# Patient Record
Sex: Female | Born: 1985 | Race: Black or African American | Hispanic: No | Marital: Single | Smoking: Current every day smoker
Health system: Southern US, Community
[De-identification: ages and names within clinical notes are randomized; demographics above are authoritative.]

---

## 2017-04-22 ENCOUNTER — Emergency Department (HOSPITAL_COMMUNITY)
Admission: EM | Admit: 2017-04-22 | Discharge: 2017-04-22 | Disposition: A | Discharge status: Home / Self Care | Payer: Self-pay | Attending: Emergency Medicine | Admitting: Emergency Medicine

## 2017-04-22 ENCOUNTER — Emergency Department (HOSPITAL_COMMUNITY): Payer: Self-pay

## 2017-04-22 ENCOUNTER — Encounter (HOSPITAL_COMMUNITY): Payer: Self-pay | Admitting: *Deleted

## 2017-04-22 DIAGNOSIS — F17210 Nicotine dependence, cigarettes, uncomplicated: Secondary | ICD-10-CM | POA: Insufficient documentation

## 2017-04-22 DIAGNOSIS — M79671 Pain in right foot: Secondary | ICD-10-CM | POA: Insufficient documentation

## 2017-04-22 DIAGNOSIS — R062 Wheezing: Secondary | ICD-10-CM | POA: Insufficient documentation

## 2017-04-22 DIAGNOSIS — R05 Cough: Secondary | ICD-10-CM | POA: Insufficient documentation

## 2017-04-22 DIAGNOSIS — R059 Cough, unspecified: Secondary | ICD-10-CM

## 2017-04-22 DIAGNOSIS — M79672 Pain in left foot: Secondary | ICD-10-CM | POA: Insufficient documentation

## 2017-04-22 DIAGNOSIS — J45909 Unspecified asthma, uncomplicated: Secondary | ICD-10-CM | POA: Insufficient documentation

## 2017-04-22 MED ORDER — BENZONATATE 100 MG PO CAPS: 100.0000 mg | ORAL_CAPSULE | Freq: Three times a day (TID) | ORAL | 0 refills | Status: DC | PRN

## 2017-04-22 MED ORDER — ALBUTEROL SULFATE HFA 108 (90 BASE) MCG/ACT IN AERS
1.0000 | INHALATION_SPRAY | Freq: Once | RESPIRATORY_TRACT | Status: AC
  Administered 2017-04-22: 2 via RESPIRATORY_TRACT
  Filled 2017-04-22: qty 6.7

## 2017-04-22 NOTE — ED Provider Notes (Signed)
[facility]-EMERGENCY DEPT Provider Note   CSN: [id number] Arrival date & time: 04/22/17  0813     History   Chief Complaint Chief Complaint  Patient presents with  . Cough  . Ankle Pain    HPI Laurie Holland is a 31 y.o. female with past medical history of asthma, presenting to the ED with persistent cough 3 weeks. Patient states cough began with a cold which consisted of sore throat and congestion, however those symptoms have resolved and she has had a lingering cough that is worse at night. She states cough is sometimes productive of white phlegm. Denies shortness of breath or fever. Reports history of asthma as a child, however it can sometimes act up with illnesses. States she does not have an inhaler at home. Patient also reports a second complaint today of bilateral heel pain that is worse first thing in the morning and improves as she walks around. She states they feel stiff with walking. No medications tried for pain. Denies recent injuries.  The history is provided by the patient.    History reviewed. No pertinent past medical history.  There are no active problems to display for this patient.   History reviewed. No pertinent surgical history.  OB History    No data available       Home Medications    Prior to Admission medications   Medication Sig Start Date End Date Taking? Authorizing Provider  benzonatate (TESSALON) 100 MG capsule Take 1 capsule (100 mg total) by mouth 3 (three) times daily as needed for cough. 04/22/17   Russo, [place] N, PA-C    Family History History reviewed. No pertinent family history.  Social History Social History  Substance Use Topics  . Smoking status: Current Every Day Smoker  . Smokeless tobacco: Not on file  . Alcohol use Yes     Allergies   Penicillins   Review of Systems Review of Systems  Constitutional: Negative for chills and fever.  HENT: Negative for congestion, ear pain, sore throat, trouble swallowing and  voice change.   Respiratory: Positive for cough and wheezing (at night). Negative for shortness of breath.   Cardiovascular: Negative for chest pain.  Gastrointestinal: Negative for abdominal pain and nausea.  Musculoskeletal: Positive for myalgias. Negative for joint swelling.  Neurological: Negative for numbness.     Physical Exam Updated Vital Signs BP (!) 148/94 (BP Location: Right Arm)   Pulse (!) 106   Temp 98.6 F (37 C) (Oral)   Resp 17   LMP 03/31/2017   SpO2 100%   Physical Exam  Constitutional: She appears well-developed and well-nourished. No distress.  HENT:  Head: Normocephalic and atraumatic.  Right Ear: Hearing, tympanic membrane, external ear and ear canal normal.  Left Ear: Hearing, tympanic membrane, external ear and ear canal normal.  Nose: Nose normal.  Mouth/Throat: Uvula is midline and oropharynx is clear and moist. No trismus in the jaw. No uvula swelling.  Eyes: Conjunctivae are normal.  Neck: Normal range of motion. Neck supple. No tracheal deviation present.  Cardiovascular: Normal rate, regular rhythm, normal heart sounds and intact distal pulses.   Pulmonary/Chest: Effort normal and breath sounds normal. No stridor. No respiratory distress. She has no wheezes. She has no rales.  Abdominal: Soft. Bowel sounds are normal. There is no tenderness.  Musculoskeletal:  Bilateral arches of feet and heels with tenderness. Ankles and dorsal feet without tenderness, normal range of motion, no edema or deformities. Distal pulses. Normal sensation.  Lymphadenopathy:  She has no cervical adenopathy.  Psychiatric: She has a normal mood and affect. Her behavior is normal.  Nursing note and vitals reviewed.    ED Treatments / Results  Labs (all labs ordered are listed, but only abnormal results are displayed) Labs Reviewed - No data to display  EKG  EKG Interpretation None       Radiology Dg Chest 2 View  Result Date: 04/22/2017 CLINICAL DATA:   Persistent cough, 3 weeks duration with sputum production. EXAM: CHEST  2 VIEW COMPARISON:  None. FINDINGS: Heart size is normal. Mediastinal shadows are normal. The lungs are clear. No bronchial thickening. No infiltrate, mass, effusion or collapse. Pulmonary vascularity is normal. No bony abnormality. IMPRESSION: Normal chest Electronically Signed   By: Paulina Fusi M.D.   On: 04/22/2017 08:53    Procedures Procedures (including critical care time)  Medications Ordered in ED Medications  albuterol (PROVENTIL HFA;VENTOLIN HFA) 108 (90 Base) MCG/ACT inhaler 1-2 puff (2 puffs Inhalation Given 04/22/17 0914)     Initial Impression / Assessment and Plan / ED Course  I have reviewed the triage vital signs and the nursing notes.  Pertinent labs & imaging results that were available during my care of the patient were reviewed by me and considered in my medical decision making (see chart for details).     Presenting with persistent cough that is worse in the evening, as well as bilateral heel pain and stiffness in the mornings. Lungs are clear, ENT exam unremarkable. Suspect bronchospasm secondary to recent viral illness versus asthma. No respiratory distress in ED. Vital signs stable. Regarding bilateral foot pain, suspect plantar fasciitis versus tendinitis. Conservative therapy discussed and recommended. Patient to follow up with PCP regarding today's visit. Will discharge with albuterol inhaler and Tessalon perles. Patient is safe for discharge.  Discussed results, findings, treatment and follow up. Patient advised of return precautions. Patient verbalized understanding and agreed with plan.   Final Clinical Impressions(s) / ED Diagnoses   Final diagnoses:  Cough  Bilateral foot pain    New Prescriptions New Prescriptions   BENZONATATE (TESSALON) 100 MG CAPSULE    Take 1 capsule (100 mg total) by mouth 3 (three) times daily as needed for cough.     Russo, [place] N, PA-C 04/22/17  9604    Vanetta Mulders, MD 04/24/17 [phone]

## 2017-04-22 NOTE — ED Triage Notes (Addendum)
Pt reports having a persistent cough x 3 weeks with white sputum. Denies fever. Airway intact at triage. Also reports ongoing bilateral ankle pain and stiffness in morning. No injury and ambulatory at triage.

## 2017-04-22 NOTE — ED Notes (Signed)
Patient transported to X-ray. Family waiting in pt room.

## 2017-04-22 NOTE — Discharge Instructions (Signed)
Please read instructions below. Apply ice to your feet for 20 minutes at a time. Do gentle stretches daily to help symptoms. You can take advil every 6 hours as needed for pain. You can take tessalon every 8 hours as needed for cough.  Use the albuterol inhaler if you begin wheezing or have shortness of breath. You can use 1-2 puffs every 6 hours as needed. Schedule an appointment with your primary provider if your cough persists and to follow up on your foot pain. Return to the ER for shortness of breath not relieved by the inhaler, worsening cough, fever or new or concerning symptoms.

## 2017-04-29 ENCOUNTER — Encounter (HOSPITAL_COMMUNITY): Payer: Self-pay | Admitting: Emergency Medicine

## 2017-04-29 ENCOUNTER — Emergency Department (HOSPITAL_COMMUNITY)
Admission: EM | Admit: 2017-04-29 | Discharge: 2017-04-29 | Disposition: A | Discharge status: Home / Self Care | Payer: Self-pay | Attending: Emergency Medicine | Admitting: Emergency Medicine

## 2017-04-29 DIAGNOSIS — F17200 Nicotine dependence, unspecified, uncomplicated: Secondary | ICD-10-CM | POA: Insufficient documentation

## 2017-04-29 DIAGNOSIS — J4521 Mild intermittent asthma with (acute) exacerbation: Secondary | ICD-10-CM | POA: Insufficient documentation

## 2017-04-29 DIAGNOSIS — J069 Acute upper respiratory infection, unspecified: Secondary | ICD-10-CM | POA: Insufficient documentation

## 2017-04-29 DIAGNOSIS — R05 Cough: Secondary | ICD-10-CM

## 2017-04-29 DIAGNOSIS — Z720 Tobacco use: Secondary | ICD-10-CM

## 2017-04-29 DIAGNOSIS — R059 Cough, unspecified: Secondary | ICD-10-CM

## 2017-04-29 LAB — CBC WITH DIFFERENTIAL/PLATELET
Basophils Absolute: 0 10*3/uL (ref 0.0–0.1)
Basophils Relative: 0 %
EOS ABS: 0.2 10*3/uL (ref 0.0–0.7)
Eosinophils Relative: 3 %
HCT: 40.8 % (ref 36.0–46.0)
Hemoglobin: 13.4 g/dL (ref 12.0–15.0)
LYMPHS ABS: 3.3 10*3/uL (ref 0.7–4.0)
Lymphocytes Relative: 41 %
MCH: 30.7 pg (ref 26.0–34.0)
MCHC: 32.8 g/dL (ref 30.0–36.0)
MCV: 93.4 fL (ref 78.0–100.0)
MONOS PCT: 7 %
Monocytes Absolute: 0.6 10*3/uL (ref 0.1–1.0)
NEUTROS PCT: 49 %
Neutro Abs: 3.8 10*3/uL (ref 1.7–7.7)
Platelets: 213 10*3/uL (ref 150–400)
RBC: 4.37 MIL/uL (ref 3.87–5.11)
RDW: 14.4 % (ref 11.5–15.5)
WBC: 7.9 10*3/uL (ref 4.0–10.5)

## 2017-04-29 LAB — BASIC METABOLIC PANEL
Anion gap: 8 (ref 5–15)
BUN: 12 mg/dL (ref 6–20)
CHLORIDE: 106 mmol/L (ref 101–111)
CO2: 23 mmol/L (ref 22–32)
Calcium: 9.2 mg/dL (ref 8.9–10.3)
Creatinine, Ser: 0.87 mg/dL (ref 0.44–1.00)
GFR calc Af Amer: >60 mL/min (ref >60)
GLUCOSE: 97 mg/dL (ref 65–99)
Potassium: 4.1 mmol/L (ref 3.5–5.1)
SODIUM: 137 mmol/L (ref 135–145)

## 2017-04-29 LAB — RAPID STREP SCREEN (MED CTR MEBANE ONLY): STREPTOCOCCUS, GROUP A SCREEN (DIRECT): NEGATIVE

## 2017-04-29 MED ORDER — PREDNISONE 20 MG PO TABS
60.0000 mg | ORAL_TABLET | Freq: Once | ORAL | Status: AC
  Administered 2017-04-29: 60 mg via ORAL
  Filled 2017-04-29: qty 3

## 2017-04-29 MED ORDER — IPRATROPIUM BROMIDE 0.02 % IN SOLN
0.5000 mg | Freq: Once | RESPIRATORY_TRACT | Status: AC
  Administered 2017-04-29: 0.5 mg via RESPIRATORY_TRACT
  Filled 2017-04-29: qty 2.5

## 2017-04-29 MED ORDER — ALBUTEROL SULFATE (2.5 MG/3ML) 0.083% IN NEBU
5.0000 mg | INHALATION_SOLUTION | Freq: Once | RESPIRATORY_TRACT | Status: AC
  Administered 2017-04-29: 5 mg via RESPIRATORY_TRACT
  Filled 2017-04-29: qty 6

## 2017-04-29 NOTE — ED Triage Notes (Signed)
Pt. Stated, I've had a cough, sore throat, chest pain, and headache for over a month. I was here 2 weeks ago for the same thing.

## 2017-04-29 NOTE — ED Notes (Signed)
Provider notified patient not in room also looked in all bathrooms in [facility]

## 2017-04-29 NOTE — Discharge Instructions (Signed)
Continue to stay well-hydrated. Gargle warm salt water and spit it out. Use chloraseptic spray as needed for sore throat. Continue to alternate between Tylenol and Ibuprofen for pain or fever. Use Mucinex for cough suppression/expectoration of mucus. Use netipot and flonase to help with nasal congestion. May consider over-the-counter Benadryl or other antihistamine to decrease secretions and for help with your symptoms. Take prednisone as directed until completed, starting tomorrow since you were given today's dose here in the ER today. Use inhaler as directed, as needed for cough/chest congestion/wheezing/shortness of breath. Take antibiotic as directed until completed, but keep in mind that your symptoms may be due to a virus and therefore may just take time to run its course and heal on its own despite the antibiotics**. STOP SMOKING! Follow up with the [facility] in 5-7 days for recheck of ongoing symptoms and to establish medical care. Return to emergency department for emergent changing or worsening of symptoms.

## 2017-04-29 NOTE — ED Notes (Signed)
Nurse spoke with patient stated she needs to leave soon. Provider currently assisting another patient.

## 2017-04-29 NOTE — ED Notes (Signed)
Patient not in room

## 2017-04-29 NOTE — ED Provider Notes (Signed)
[facility] Provider Note   CSN: [id number] Arrival date & time: 04/29/17  0715     History   Chief Complaint Chief Complaint  Patient presents with  . Cough  . Sore Throat  . Headache  . Chest Pain    HPI Laurie Holland is a 31 y.o. female with a PMHx of asthma, who presents to the ED with complaints of ongoing URI symptoms and asthma exacerbation symptoms that have been present for one month. Patient states that initially she began having sore throat, cough with "manilla white" sputum production, wheezing, chest tightness, intermittent shortness of breath, and rhinorrhea about one month ago. States it feels like her asthma is acting up, which she reports usually only happens when she gets sick. Chart review reveals that she was seen here on 04/22/17 for similar complaints, had a CXR done which was negative, she was discharged home with an inhaler and tessalon perles. She states that this has helped however she has not developed body aches as well. She notices that heat exposure seems to worsen her symptoms. The albuterol inhaler and Tessalon Perles have provided mild relief, and she has used Delsym, Mucinex, and halls with some relief as well. She admits to being a cigarette smoker. No known sick contacts. States this does not feel like when she had pneumonia in the past.   She denies ear pain/drainage, drooling, trismus, fevers, chills, hemoptysis, CP, LE swelling, recent travel/surgery/immobilization, estrogen use, personal/family hx of DVT/PE, abd pain, N/V/D/C, hematuria, dysuria, arthralgias, numbness, tingling, focal weakness, or any other complaints at this time.    The history is provided by the patient and medical records. No language interpreter was used.  Cough  This is a new problem. The current episode started more than 1 week ago. The problem occurs constantly. The problem has not changed since onset.The cough is productive of sputum. There has been no fever.  Associated symptoms include headaches, rhinorrhea, sore throat, myalgias (body aches), shortness of breath and wheezing. Pertinent negatives include no chest pain, no chills and no ear pain. She has tried cough syrup and decongestants (delsym, mucinex, halls, albuterol inhaler, and tessalon perles) for the symptoms. The treatment provided mild relief. She is a smoker. Her past medical history is significant for asthma.  Sore Throat  Associated symptoms include headaches and shortness of breath. Pertinent negatives include no chest pain and no abdominal pain.    History reviewed. No pertinent past medical history.  There are no active problems to display for this patient.   History reviewed. No pertinent surgical history.  OB History    No data available       Home Medications    Prior to Admission medications   Medication Sig Start Date End Date Taking? Authorizing Provider  benzonatate (TESSALON) 100 MG capsule Take 1 capsule (100 mg total) by mouth 3 (three) times daily as needed for cough. 04/22/17   Russo, [place] N, PA-C    Family History No family history on file.  Social History Social History  Substance Use Topics  . Smoking status: Current Every Day Smoker  . Smokeless tobacco: Current User  . Alcohol use Yes     Allergies   Penicillins   Review of Systems Review of Systems  Constitutional: Negative for chills and fever.  HENT: Positive for rhinorrhea and sore throat. Negative for drooling, ear discharge, ear pain and trouble swallowing.   Respiratory: Positive for cough, chest tightness, shortness of breath and wheezing.   Cardiovascular: Negative  for chest pain and leg swelling.  Gastrointestinal: Negative for abdominal pain, constipation, diarrhea, nausea and vomiting.  Genitourinary: Negative for dysuria and hematuria.  Musculoskeletal: Positive for myalgias (body aches). Negative for arthralgias.  Skin: Negative for color change.  Allergic/Immunologic:  Negative for immunocompromised state.  Neurological: Positive for headaches. Negative for weakness and numbness.  Psychiatric/Behavioral: Negative for confusion.   All other systems reviewed and are negative for acute change except as noted in the HPI.    Physical Exam Updated Vital Signs BP 128/87   Pulse 97   Temp 98 F (36.7 C) (Oral)   Resp 17   Ht  (1.651 m)   Wt 86.2 kg (190 lb)   LMP 03/31/2017   SpO2 100%   BMI 31.62 kg/m   Physical Exam  Constitutional: She is oriented to person, place, and time. Vital signs are normal. She appears well-developed and well-nourished.  Non-toxic appearance. No distress.  Afebrile, nontoxic, NAD  HENT:  Head: Normocephalic and atraumatic.  Right Ear: Hearing, tympanic membrane, external ear and ear canal normal.  Left Ear: Hearing, tympanic membrane, external ear and ear canal normal.  Nose: Mucosal edema present.  Mouth/Throat: Uvula is midline, oropharynx is clear and moist and mucous membranes are normal. No trismus in the jaw. No uvula swelling. Tonsils are 0 on the right. Tonsils are 0 on the left. No tonsillar exudate.  Ears are clear bilaterally. Nose mildly congested. Oropharynx clear and moist, without uvular swelling or deviation, no trismus or drooling, no tonsillar swelling or erythema, no exudates, no PTA.  Eyes: Conjunctivae and EOM are normal. Right eye exhibits no discharge. Left eye exhibits no discharge.  Neck: Normal range of motion. Neck supple.  Cardiovascular: Normal rate, regular rhythm, normal heart sounds and intact distal pulses.  Exam reveals no gallop and no friction rub.   No murmur heard. Pulmonary/Chest: Effort normal. No respiratory distress. She has no decreased breath sounds. She has wheezes. She has no rhonchi. She has no rales.  Faint expiratory wheezing in b/l lower fields, no rhonchi/rales, no hypoxia or increased WOB, speaking in full sentences, SpO2 100% on RA   Abdominal: Soft. Normal appearance  and bowel sounds are normal. She exhibits no distension. There is no tenderness. There is no rigidity, no rebound, no guarding, no CVA tenderness, no tenderness at McBurney's point and negative Murphy's sign.  Musculoskeletal: Normal range of motion.  Neurological: She is alert and oriented to person, place, and time. She has normal strength. No sensory deficit.  Skin: Skin is warm, dry and intact. No rash noted.  Psychiatric: She has a normal mood and affect.  Nursing note and vitals reviewed.    ED Treatments / Results  Labs (all labs ordered are listed, but only abnormal results are displayed) Labs Reviewed  RAPID STREP SCREEN (NOT AT [facility])  CULTURE, GROUP A STREP ([facility])  CBC WITH DIFFERENTIAL/PLATELET  BASIC METABOLIC PANEL    EKG  EKG Interpretation None       Radiology No results found.  Dg Chest 2 View  Result Date: 04/22/2017 CLINICAL DATA:  Persistent cough, 3 weeks duration with sputum production. EXAM: CHEST  2 VIEW COMPARISON:  None. FINDINGS: Heart size is normal. Mediastinal shadows are normal. The lungs are clear. No bronchial thickening. No infiltrate, mass, effusion or collapse. Pulmonary vascularity is normal. No bony abnormality. IMPRESSION: Normal chest Electronically Signed   By: Paulina Fusi M.D.   On: 04/22/2017 08:53     Procedures Procedures (including  critical care time)  Medications Ordered in ED Medications  albuterol (PROVENTIL) (2.5 MG/3ML) 0.083% nebulizer solution 5 mg (5 mg Nebulization Given 04/29/17 1040)  ipratropium (ATROVENT) nebulizer solution 0.5 mg (0.5 mg Nebulization Given 04/29/17 1040)  predniSONE (DELTASONE) tablet 60 mg (60 mg Oral Given 04/29/17 1039)     Initial Impression / Assessment and Plan / ED Course  I have reviewed the triage vital signs and the nursing notes.  Pertinent labs & imaging results that were available during my care of the patient were reviewed by me and considered in my medical decision making (see  chart for details).     31 y.o. female here with ongoing cough, sore throat, body aches, and asthma exacerbation symptoms x1 month. Was seen 1 wk ago and had a neg CXR. +Smoker which isn't helping her situation; smoking cessation strongly encouraged during evaluation today. On exam, throat clear, ears clear, lungs with faint expiratory wheeze in lower lung fields but no rhonchi/rales, no hypoxia or tachycardia, otherwise unremarkable exam. Labs done in triage reveal: RST neg, CBC and BMP WNL. Likely asthma exacerbation. Will give duoneb and prednisone then reassess. Doubt need for further emergent work up at this time, or repeat imaging. Will reassess shortly.   1:07 PM Pt apparently eloped after her breathing treatment was done, did not inform anyone that she was leaving. Was not evaluated after the breathing treatment. No instructions were able to be given due to the fact that she eloped without our knowledge. Pt stable at time of elopement. If she returns, it'd be great to have the opportunity of discharging her with a few days of prednisone and an inhaler refill, as well as [facility] f/up instructions; however, doubt need to call pt and ask her to return at this point; if she comes back, please give her these instructions.    Final Clinical Impressions(s) / ED Diagnoses   Final diagnoses:  Mild intermittent asthma with exacerbation  Upper respiratory tract infection, unspecified type  Cough  Tobacco user    New Prescriptions New Prescriptions   No medications on [address] [place], [place] 04/29/17 1309    Mancel Bale, MD 04/29/17 1751

## 2017-05-01 ENCOUNTER — Encounter (HOSPITAL_COMMUNITY): Payer: Self-pay | Admitting: *Deleted

## 2017-05-01 ENCOUNTER — Emergency Department (HOSPITAL_COMMUNITY)
Admission: EM | Admit: 2017-05-01 | Discharge: 2017-05-01 | Disposition: A | Discharge status: Home / Self Care | Payer: Self-pay | Attending: Emergency Medicine | Admitting: Emergency Medicine

## 2017-05-01 DIAGNOSIS — Z79899 Other long term (current) drug therapy: Secondary | ICD-10-CM | POA: Insufficient documentation

## 2017-05-01 DIAGNOSIS — H00011 Hordeolum externum right upper eyelid: Secondary | ICD-10-CM | POA: Insufficient documentation

## 2017-05-01 DIAGNOSIS — F17200 Nicotine dependence, unspecified, uncomplicated: Secondary | ICD-10-CM | POA: Insufficient documentation

## 2017-05-01 DIAGNOSIS — J4520 Mild intermittent asthma, uncomplicated: Secondary | ICD-10-CM | POA: Insufficient documentation

## 2017-05-01 LAB — CULTURE, GROUP A STREP (THRC)

## 2017-05-01 MED ORDER — PREDNISONE 20 MG PO TABS: ORAL_TABLET | ORAL | 0 refills | Status: DC

## 2017-05-01 MED ORDER — BENZONATATE 100 MG PO CAPS: 100.0000 mg | ORAL_CAPSULE | Freq: Three times a day (TID) | ORAL | 0 refills | Status: DC

## 2017-05-01 NOTE — ED Provider Notes (Signed)
[facility]-EMERGENCY DEPT Provider Note   CSN: [id number] Arrival date & time: 05/01/17  4098     History   Chief Complaint Chief Complaint  Patient presents with  . Cough  . Eye Problem    HPI Laurie Holland is a 31 y.o. female.  Patient is a 31 year old female who presents with cough. She states the cough is been going on for a month. It started with some other URI symptoms. She states those symptoms have improved but she still has an ongoing cough which is mostly dry. She denies any fevers. No chest pain. She does feel wheezy at times. She denies any diagnosed history of asthma but does get wheezing at times with upper respiratory infections. She denies any fevers. No current shortness of breath. No leg pain or swelling. She's been using albuterol inhaler which improved her symptoms for a couple hours but then they come back. She also has a stye to her right upper eyelid. She states she knows that this morning.  She's had styes in the same place in the past.      History reviewed. No pertinent past medical history.  There are no active problems to display for this patient.   History reviewed. No pertinent surgical history.  OB History    No data available       Home Medications    Prior to Admission medications   Medication Sig Start Date End Date Taking? Authorizing Provider  acetaminophen (TYLENOL) 325 MG tablet Take 650 mg by mouth every 6 (six) hours as needed.    [provider]  benzonatate (TESSALON) 100 MG capsule Take 1 capsule (100 mg total) by mouth every 8 (eight) hours. 05/01/17   Rolan Bucco, MD  diphenhydrAMINE (BENADRYL) 25 MG tablet Take 50 mg by mouth every 6 (six) hours as needed.    [provider]  pediatric multivitamin-fluoride (POLY-VI-FLOR) 0.25 MG chewable tablet Chew 1 tablet by mouth daily.    [provider]  predniSONE (DELTASONE) 20 MG tablet 3 tabs po day one, then 2 po daily x 4  days 05/01/17   Rolan Bucco, MD  tetrahydrozoline 0.05 % ophthalmic solution Place 2 drops into both eyes daily as needed (ichy eyes).    [provider]    Family History No family history on file.  Social History Social History  Substance Use Topics  . Smoking status: Current Every Day Smoker  . Smokeless tobacco: Current User  . Alcohol use Yes     Allergies   Penicillins   Review of Systems Review of Systems  Constitutional: Negative for chills, diaphoresis, fatigue and fever.  HENT: Negative for congestion, rhinorrhea and sneezing.   Eyes: Negative for pain, discharge, redness and visual disturbance.       Stye  Respiratory: Positive for cough and wheezing. Negative for chest tightness and shortness of breath.   Cardiovascular: Negative for chest pain and leg swelling.  Gastrointestinal: Negative for abdominal pain, blood in stool, diarrhea, nausea and vomiting.  Genitourinary: Negative for difficulty urinating, flank pain, frequency and hematuria.  Musculoskeletal: Negative for arthralgias and back pain.  Skin: Negative for rash.  Neurological: Negative for dizziness, speech difficulty, weakness, numbness and headaches.     Physical Exam Updated Vital Signs BP 133/85 (BP Location: Right Arm)   Pulse 99   Temp 98.1 F (36.7 C) (Oral)   Resp 18   LMP 03/31/2017   SpO2 100%   Physical Exam  Constitutional: She is oriented  to person, place, and time. She appears well-developed and well-nourished.  HENT:  Head: Normocephalic and atraumatic.  Eyes: Pupils are equal, round, and reactive to light.  Patient hasa tiny punctate stye involving the right upper eyelid. There is mild surrounding swelling. No orbital swelling. No pain with eye movements. No conjunctival injection. No drainage from the eye.  Neck: Normal range of motion. Neck supple.  Cardiovascular: Normal rate, regular rhythm and normal heart sounds.   Pulmonary/Chest: Effort normal. No  respiratory distress. She has wheezes. She has no rales. She exhibits no tenderness.  Patient has slight expiratory wheezing with no increased work of breathing. No retractions. No tachypnea.  Abdominal: Soft. Bowel sounds are normal. There is no tenderness. There is no rebound and no guarding.  Musculoskeletal: Normal range of motion. She exhibits no edema.  Lymphadenopathy:    She has no cervical adenopathy.  Neurological: She is alert and oriented to person, place, and time.  Skin: Skin is warm and dry. No rash noted.  Psychiatric: She has a normal mood and affect.     ED Treatments / Results  Labs (all labs ordered are listed, but only abnormal results are displayed) Labs Reviewed - No data to display  EKG  EKG Interpretation None       Radiology No results found.  Procedures Procedures (including critical care time)  Medications Ordered in ED Medications - No data to display   Initial Impression / Assessment and Plan / ED Course  I have reviewed the triage vital signs and the nursing notes.  Pertinent labs & imaging results that were available during my care of the patient were reviewed by me and considered in my medical decision making (see chart for details).     Patient presents with a persistent cough. She does have some associated wheezing. I will start her on a prednisone pack and she will continue to use her albuterol inhaler. I also gave her prescription for Tessalon Perles. She doesn't have any symptoms that would be more suggestive of pneumonia and I don't feel that she needs repeat imaging of her chest today. She doesn't have any symptoms that would be more consistent with pulmonary embolus. There is no hypoxia. No current reported shortness of breath. She was encouraged to establish care with a primary care provider. She also has a small stye in her right eye. I don't feel that there is any surrounding infection. I advised her to use warm compresses to the  area. She states she's had several styes in the same location and I did give her referral to follow-up with ophthalmology if her symptoms are not improving. Return precautions were given.  Final Clinical Impressions(s) / ED Diagnoses   Final diagnoses:  Mild intermittent asthma, unspecified whether complicated  Hordeolum externum of right upper eyelid    New Prescriptions New Prescriptions   BENZONATATE (TESSALON) 100 MG CAPSULE    Take 1 capsule (100 mg total) by mouth every 8 (eight) hours.   PREDNISONE (DELTASONE) 20 MG TABLET    3 tabs po day one, then 2 po daily x 4 days     Rolan Bucco, MD 05/01/17 [phone]

## 2017-05-01 NOTE — ED Notes (Signed)
Bed: WTR6 Expected date:  Expected time:  Means of arrival:  Comments: 

## 2017-05-01 NOTE — ED Triage Notes (Signed)
Pt complains of cough for 1 month. Pt had strep test and chest x-ray 2 weeks ago, which were negative. Pt also complains of right eyelid swelling, is concerned she has sty.

## 2017-06-09 ENCOUNTER — Encounter (HOSPITAL_COMMUNITY): Payer: Self-pay

## 2017-06-09 ENCOUNTER — Emergency Department (HOSPITAL_COMMUNITY)
Admission: EM | Admit: 2017-06-09 | Discharge: 2017-06-09 | Disposition: A | Discharge status: Home / Self Care | Payer: Self-pay | Attending: Emergency Medicine | Admitting: Emergency Medicine

## 2017-06-09 DIAGNOSIS — Z79899 Other long term (current) drug therapy: Secondary | ICD-10-CM | POA: Insufficient documentation

## 2017-06-09 DIAGNOSIS — J45909 Unspecified asthma, uncomplicated: Secondary | ICD-10-CM | POA: Insufficient documentation

## 2017-06-09 DIAGNOSIS — J302 Other seasonal allergic rhinitis: Secondary | ICD-10-CM | POA: Insufficient documentation

## 2017-06-09 DIAGNOSIS — F17210 Nicotine dependence, cigarettes, uncomplicated: Secondary | ICD-10-CM | POA: Insufficient documentation

## 2017-06-09 MED ORDER — ALBUTEROL SULFATE HFA 108 (90 BASE) MCG/ACT IN AERS: 1.0000 | INHALATION_SPRAY | Freq: Four times a day (QID) | RESPIRATORY_TRACT | 0 refills | Status: DC | PRN

## 2017-06-09 NOTE — ED Provider Notes (Addendum)
[facility] Provider Note  CSN: [id number] Arrival date & time: 06/09/17 [id number]  Chief Complaint(s) Cough  HPI Laurie Holland is a 31 y.o. female with reported h/o Asthma and seasonal allergies.  The history is provided by the patient.  Cough  This is a recurrent problem. Episode onset: 2-3 months. Episode frequency: daily. The problem has not changed since onset.The cough is non-productive. There has been no fever. Associated symptoms include chest pain (only with coughing), rhinorrhea, shortness of breath and wheezing. Pertinent negatives include no chills and no sweats. Associated symptoms comments: Nasal congestion. Treatments tried: steroids Rx'd in the ED several weeks ago which did help, but cough returned after course completion. Her past medical history is significant for asthma.    Past Medical History History reviewed. No pertinent past medical history. There are no active problems to display for this patient.  Home Medication(s) Prior to Admission medications   Medication Sig Start Date End Date Taking? Authorizing Provider  acetaminophen (TYLENOL) 325 MG tablet Take 650 mg by mouth every 6 (six) hours as needed.    [provider]  albuterol (PROVENTIL HFA;VENTOLIN HFA) 108 (90 Base) MCG/ACT inhaler Inhale 1-2 puffs into the lungs every 6 (six) hours as needed for wheezing or shortness of breath. 06/09/17   Nira Connardama, Arelene Moroni Eduardo, MD  benzonatate (TESSALON) 100 MG capsule Take 1 capsule (100 mg total) by mouth every 8 (eight) hours. 05/01/17   Rolan BuccoBelfi, Melanie, MD  diphenhydrAMINE (BENADRYL) 25 MG tablet Take 50 mg by mouth every 6 (six) hours as needed.    [provider]  pediatric multivitamin-fluoride (POLY-VI-FLOR) 0.25 MG chewable tablet Chew 1 tablet by mouth daily.    [provider]  predniSONE (DELTASONE) 20 MG tablet 3 tabs po day one, then 2 po daily x 4 days 05/01/17   Rolan BuccoBelfi, Melanie, MD    tetrahydrozoline 0.05 % ophthalmic solution Place 2 drops into both eyes daily as needed (ichy eyes).    [provider]                                                                                                                                    Past Surgical History History reviewed. No pertinent surgical history. Family History History reviewed. No pertinent family history.  Social History Social History   Tobacco Use  . Smoking status: Current Every Day Smoker  . Smokeless tobacco: Current User  Substance Use Topics  . Alcohol use: Yes  . Drug use: No   Allergies Penicillins  Review of Systems Review of Systems  Constitutional: Negative for chills.  HENT: Positive for rhinorrhea.   Respiratory: Positive for cough, shortness of breath and wheezing.   Cardiovascular: Positive for chest pain (only with coughing).   All other systems are reviewed and are negative for acute change except as noted in the HPI  Physical Exam Vital Signs  I have reviewed the triage  vital signs BP 128/79   Pulse 96   Temp 98.2 F (36.8 C)   Resp 18   Ht 5\' 5"  (1.651 m)   Wt 113.4 kg (250 lb)   LMP 05/19/2017 (Approximate)   SpO2 100%   BMI 41.60 kg/m   Physical Exam  Constitutional: Laurie Holland is oriented to person, place, and time. Laurie Holland appears well-developed and well-nourished. No distress.  HENT:  Head: Normocephalic and atraumatic.  Nose: Mucosal edema and rhinorrhea present.  Post nasal drip  Eyes: Conjunctivae and EOM are normal. Pupils are equal, round, and reactive to light. Right eye exhibits no discharge. Left eye exhibits no discharge. No scleral icterus.  Neck: Normal range of motion. Neck supple.  Cardiovascular: Normal rate and regular rhythm. Exam reveals no gallop and no friction rub.  No murmur heard. Pulmonary/Chest: Effort normal and breath sounds normal. No stridor. No respiratory distress. Laurie Holland has no rales.  Abdominal: Soft. Laurie Holland exhibits no distension.  There is no tenderness.  Musculoskeletal: Laurie Holland exhibits no edema or tenderness.  Neurological: Laurie Holland is alert and oriented to person, place, and time.  Skin: Skin is warm and dry. No rash noted. Laurie Holland is not diaphoretic. No erythema.  Psychiatric: Laurie Holland has a normal mood and affect.  Vitals reviewed.   ED Results and Treatments Labs (all labs ordered are listed, but only abnormal results are displayed) Labs Reviewed - No data to display                                                                                                                       EKG  EKG Interpretation  Date/Time:    Ventricular Rate:    PR Interval:    QRS Duration:   QT Interval:    QTC Calculation:   R Axis:     Text Interpretation:        Radiology No results found. Pertinent labs & imaging results that were available during my care of the patient were reviewed by me and considered in my medical decision making (see chart for details).  Medications Ordered in ED Medications - No data to display                                                                                                                                  Procedures Procedures  (including critical care time)  Medical Decision Making / ED Course I have reviewed the nursing notes  for this encounter and the patient's prior records (if available in EHR or on provided paperwork).    31 y.o. female presents with cough, rhinorrhea, and nasal congestion several months. adequate oral hydration. Rest of history as above.  Patient appears well. No signs of toxicity, patient is interactive and playful. No hypoxia, tachypnea or other signs of respiratory distress. No sign of clinical dehydration. Lung exam clear. Rest of exam as above.  Most consistent with allergic rhinitis.   No evidence suggestive of pharyngitis, sinusitis, or  PNA.   Chest x-ray not indicated at this time.  Discussed symptomatic treatment with the patient and they will  follow closely with their PCP.    Final Clinical Impression(s) / ED Diagnoses Final diagnoses:  Seasonal allergic rhinitis, unspecified trigger   Disposition: Discharge  Condition: Good  I have discussed the results, Dx and Tx plan with the patient who expressed understanding and agree(s) with the plan. Discharge instructions discussed at great length. The patient was given strict return precautions who verbalized understanding of the instructions. No further questions at time of discharge.    ED Discharge Orders        Ordered    albuterol (PROVENTIL HFA;VENTOLIN HFA) 108 (90 Base) MCG/ACT inhaler  Every 6 hours PRN     06/09/17 0745       Follow Up: Primary care provider   If you do not have a primary care physician, contact [facility] at [phone] for referral      This chart was dictated using voice recognition software.  Despite best efforts to proofread,  errors can occur which can change the documentation meaning.     Nira Conn, MD 06/22/17 [phone]

## 2017-06-09 NOTE — ED Triage Notes (Signed)
Pt presents with c/o cough that she reports she has had for 2 months, off and on. Pt reports she feels short of breath and her ribs hurt when she takes a deep breath. Pt is in no acute distress at this time. Pt also c/o recurring stye on her left eye.

## 2017-06-09 NOTE — Discharge Instructions (Addendum)
If you do not have a primary care physician then it is very important that you develop a relationship with one.  Please contact [facility] at [phone] for a referral to many excellent primary care physicians in the community.   ° ° °

## 2017-06-16 ENCOUNTER — Other Ambulatory Visit: Payer: Self-pay

## 2017-06-16 ENCOUNTER — Emergency Department (HOSPITAL_COMMUNITY)
Admission: EM | Admit: 2017-06-16 | Discharge: 2017-06-16 | Disposition: A | Discharge status: Home / Self Care | Payer: Self-pay | Attending: Emergency Medicine | Admitting: Emergency Medicine

## 2017-06-16 ENCOUNTER — Encounter (HOSPITAL_COMMUNITY): Payer: Self-pay | Admitting: Nurse Practitioner

## 2017-06-16 DIAGNOSIS — Z87891 Personal history of nicotine dependence: Secondary | ICD-10-CM | POA: Insufficient documentation

## 2017-06-16 DIAGNOSIS — R059 Cough, unspecified: Secondary | ICD-10-CM

## 2017-06-16 DIAGNOSIS — Z79899 Other long term (current) drug therapy: Secondary | ICD-10-CM | POA: Insufficient documentation

## 2017-06-16 DIAGNOSIS — R05 Cough: Secondary | ICD-10-CM | POA: Insufficient documentation

## 2017-06-16 MED ORDER — BENZONATATE 100 MG PO CAPS: 100.0000 mg | ORAL_CAPSULE | Freq: Three times a day (TID) | ORAL | 0 refills | Status: DC | PRN

## 2017-06-16 MED ORDER — FLUTICASONE PROPIONATE 50 MCG/ACT NA SUSP: 1.0000 | Freq: Every day | NASAL | 2 refills | Status: DC

## 2017-06-16 MED ORDER — RANITIDINE HCL 150 MG PO TABS: 150.0000 mg | ORAL_TABLET | Freq: Two times a day (BID) | ORAL | 0 refills | Status: DC

## 2017-06-16 NOTE — ED Triage Notes (Signed)
Pt presents with c/o cough that she has had for over 2 months on and off. Reports she feels as if she is short of breath when she takes a deep breath. Patient is ambulatory and can talk in full sentences without distress. Patient states she was seen here on last week and by her PCP who informed her she had allergies and a cold. She reports receiving a RX for inhaler here but was unable to afford due to it being $95.

## 2017-06-16 NOTE — ED Provider Notes (Signed)
[facility]-EMERGENCY DEPT Provider Note   CSN: [id number] Arrival date & time: 06/16/17  4098     History   Chief Complaint Chief Complaint  Patient presents with  . Cough  . Shortness of Breath    HPI Laurie Holland is a 30 y.o. female with a history of childhood asthma who presents to the emergency department with complaints of cough for the past 2 months.  States that the cough is productive with green mucous sputum.  Reports associated congestion, rhinorrhea, and left ear fullness. Experiencing chest discomfort only with coughing as well as difficulty breathing specific to coughing spells. States she is also having some reflux following eating, notes that sometimes this seems to worsen the cough.  Has been to the emergency department 4 times previously over the past 1 month for same symptoms-has tried over-the-counter cough medicine, cough drops, NyQuil, and been prescribed steroids all with some relief, but not resolution. Was prescribed an inhaler at last visit, but states she was unable to get this due to cost.  Denies fever, chills, sore throat, lightheadedness, dizziness, or leg pain/swelling.   HPI  History reviewed. No pertinent past medical history.  There are no active problems to display for this patient.   History reviewed. No pertinent surgical history.  OB History    No data available       Home Medications    Prior to Admission medications   Medication Sig Start Date End Date Taking? Authorizing Provider  acetaminophen (TYLENOL) 325 MG tablet Take 650 mg by mouth every 6 (six) hours as needed.    [provider]  albuterol (PROVENTIL HFA;VENTOLIN HFA) 108 (90 Base) MCG/ACT inhaler Inhale 1-2 puffs into the lungs every 6 (six) hours as needed for wheezing or shortness of breath. 06/09/17   Nira Conn, MD  benzonatate (TESSALON) 100 MG capsule Take 1 capsule (100 mg total) by mouth 3 (three) times daily as needed  for cough. 06/16/17   Nancee Brownrigg R, PA-C  diphenhydrAMINE (BENADRYL) 25 MG tablet Take 50 mg by mouth every 6 (six) hours as needed.    [provider]  fluticasone (FLONASE) 50 MCG/ACT nasal spray Place 1 spray into both nostrils daily. 06/16/17   Ricki Vanhandel, Pleas Koch, PA-C  pediatric multivitamin-fluoride (POLY-VI-FLOR) 0.25 MG chewable tablet Chew 1 tablet by mouth daily.    [provider]  predniSONE (DELTASONE) 20 MG tablet 3 tabs po day one, then 2 po daily x 4 days 05/01/17   Rolan Bucco, MD  ranitidine (ZANTAC) 150 MG tablet Take 1 tablet (150 mg total) by mouth 2 (two) times daily. 06/16/17   Tanice Petre R, PA-C  tetrahydrozoline 0.05 % ophthalmic solution Place 2 drops into both eyes daily as needed (ichy eyes).    [provider]    Family History History reviewed. No pertinent family history.  Social History Social History   Tobacco Use  . Smoking status: Current Every Day Smoker  . Smokeless tobacco: Current User  Substance Use Topics  . Alcohol use: Yes  . Drug use: No     Allergies   Penicillins   Review of Systems Review of Systems  Constitutional: Negative for chills and fever.  HENT: Positive for congestion, ear pain (L) and rhinorrhea. Negative for sore throat.   Eyes: Negative for discharge and visual disturbance.  Respiratory: Positive for cough and shortness of breath (Only with coughing spells, otherwise none). Negative for wheezing.   Cardiovascular: Positive for chest pain (Only  with coughing, otherwise none). Negative for palpitations and leg swelling.  Gastrointestinal: Negative for abdominal pain, constipation, diarrhea, nausea and vomiting.  Genitourinary: Negative for dysuria.  Neurological: Negative for dizziness, syncope and weakness.  All other systems reviewed and are negative.    Physical Exam Updated Vital Signs BP 130/87 (BP Location: Left Arm)   Pulse 80   Temp 98.3 F (36.8 C) (Oral)    Resp 17   Ht 5\' 5"  (1.651 m)   Wt 102.1 kg (225 lb)   LMP 05/19/2017 (Approximate)   SpO2 99%   BMI 37.44 kg/m   Physical Exam  Constitutional: She appears well-developed and well-nourished. No distress.  HENT:  Head: Normocephalic and atraumatic.  Right Ear: Tympanic membrane is not perforated, not erythematous, not retracted and not bulging.  Left Ear: Tympanic membrane is not perforated, not erythematous, not retracted and not bulging.  Nose: Right sinus exhibits no maxillary sinus tenderness and no frontal sinus tenderness. Left sinus exhibits no maxillary sinus tenderness and no frontal sinus tenderness.  Mouth/Throat: Uvula is midline and oropharynx is clear and moist. No oropharyngeal exudate or posterior oropharyngeal erythema.  Nasal congestion present. Boggy turbinates.   Eyes: Conjunctivae are normal. Pupils are equal, round, and reactive to light. Right eye exhibits no discharge. Left eye exhibits no discharge.  Neck: Normal range of motion. Neck supple.  Cardiovascular: Normal rate and regular rhythm.  No murmur heard. Pulmonary/Chest: Breath sounds normal. No respiratory distress. She has no wheezes. She has no rales.  Abdominal: Soft. She exhibits no distension. There is no tenderness.  Lymphadenopathy:    She has no cervical adenopathy.  Neurological: She is alert.  Skin: Skin is warm and dry. No rash noted.  Psychiatric: She has a normal mood and affect. Her behavior is normal.  Nursing note and vitals reviewed.    ED Treatments / Results  Labs (all labs ordered are listed, but only abnormal results are displayed) Labs Reviewed - No data to display  EKG  EKG Interpretation None       Radiology No results found.  Procedures Procedures (including critical care time)  Medications Ordered in ED Medications - No data to display   Initial Impression / Assessment and Plan / ED Course  I have reviewed the triage vital signs and the nursing  notes.  Pertinent labs & imaging results that were available during my care of the patient were reviewed by me and considered in my medical decision making (see chart for details).   Patient presents with complaint of cough. She is nontoxic appearing with stable vital signs. No hypoxia, tachypnea or other signs of respiratory distress. Given persistence of sxs, patient is afebrile, and without adventitious sounds on lung exam doubt pneumonia. Had CXR 04/22/17 at initial visit for sxs which was normal. Patient does not report wheezing, no wheezing on exam, doubt asthma related at this time-discussed getting previously prescribed inhaler if she feels she is wheezing or returning to ED. No signs of sinusitis or pharyngitis. Considering allergic rhinitis vs. GERD given patient's reported reflux and worsened coughing following eating.  Will treat with Flonase and Tessalon for potential allergic rhinitis as well as Zantac for potential GERD.    Discussed treatment plan, ED return precautions, and need for primary care follow-up in 1 week with patient.  Provided information for an internal medicine office as well as the community clinic. Provided opportunity for questions, patient confirmed understanding and is in agreement with plan.   Final Clinical Impressions(s) /  ED Diagnoses   Final diagnoses:  Cough    ED Discharge Orders        Ordered    benzonatate (TESSALON) 100 MG capsule  3 times daily PRN     06/16/17 1031    fluticasone (FLONASE) 50 MCG/ACT nasal spray  Daily     06/16/17 1031    ranitidine (ZANTAC) 150 MG tablet  2 times daily     06/16/17 [address]1031       Galaxy Borden, IraanSamantha R, PA-C 06/16/17 1048    Gerhard MunchLockwood, Robert, MD 06/16/17 1554

## 2017-06-16 NOTE — Discharge Instructions (Signed)
You were seen in the emergency department today for your persistent cough.  I have prescribed you Flonase for nasal congestion, use 1 spray in each nostril every day.  I have prescribed you Tessalon for cough, you may you take this once every 8 hours.  I have also prescribed you Zantac this is used to treat reflux which I believe may be contributing to your cough.  Take this once in the morning and once at night.  I have prescribed a new medication for you today. It is important that when you pick the prescription up you discuss the potential interactions of this medication with other medications you are taking, including over the counter medications, with the pharmacists.  This new medication has potential side effects. Be sure to contact your primary care provider or return to the emergency department if you are experiencing new symptoms that you are unable to tolerate after starting the medication. You need to receive medical evaluation immediately if you start to experience blistering of the skin, rash, swelling, or difficulty breathing as these signs could indicate a more serious medication side effect.    Follow-up with either the internal medicine office or the community clinic office that I provided you with for reevaluation of your symptoms and possible medication adjustments in 1 week. Return to the emergency department for any new/worsening symptoms including but not limited to difficulty breathing, chest pain, blue/pale appearance, or coughing up blood.

## 2017-11-11 ENCOUNTER — Emergency Department (HOSPITAL_COMMUNITY): Payer: Self-pay

## 2017-11-11 ENCOUNTER — Encounter (HOSPITAL_COMMUNITY): Payer: Self-pay | Admitting: Emergency Medicine

## 2017-11-11 ENCOUNTER — Emergency Department (HOSPITAL_COMMUNITY)
Admission: EM | Admit: 2017-11-11 | Discharge: 2017-11-11 | Disposition: A | Discharge status: Home / Self Care | Payer: Self-pay | Attending: Emergency Medicine | Admitting: Emergency Medicine

## 2017-11-11 DIAGNOSIS — Z79899 Other long term (current) drug therapy: Secondary | ICD-10-CM | POA: Insufficient documentation

## 2017-11-11 DIAGNOSIS — F17210 Nicotine dependence, cigarettes, uncomplicated: Secondary | ICD-10-CM | POA: Insufficient documentation

## 2017-11-11 DIAGNOSIS — L03011 Cellulitis of right finger: Secondary | ICD-10-CM | POA: Insufficient documentation

## 2017-11-11 MED ORDER — SULFAMETHOXAZOLE-TRIMETHOPRIM 800-160 MG PO TABS: 1.0000 | ORAL_TABLET | Freq: Two times a day (BID) | ORAL | 0 refills | Status: AC

## 2017-11-11 MED ORDER — LIDOCAINE HCL (PF) 1 % IJ SOLN
30.0000 mL | Freq: Once | INTRAMUSCULAR | Status: AC
  Administered 2017-11-11: 30 mL
  Filled 2017-11-11: qty 30

## 2017-11-11 NOTE — ED Provider Notes (Signed)
[facility]-EMERGENCY DEPT Provider Note   CSN: [id number] Arrival date & time: 11/11/17  1553     History   Chief Complaint Chief Complaint  Patient presents with  . swollen finger    HPI Laurie Holland is a 32 y.o. female here for evaluation of right middle finger pain, swelling, warmth x 3 weeks. States she cut herself with a dish at work 3 weeks ago, since then symptoms gradually worsening. Pain is severe, acute.  Aggravating factors: direct palpation. Has tried aleve without relief. No fevers, swelling or redness up into hand.no IVDU.   HPI  History reviewed. No pertinent past medical history.  There are no active problems to display for this patient.   History reviewed. No pertinent surgical history.   OB History   None      Home Medications    Prior to Admission medications   Medication Sig Start Date End Date Taking? Authorizing Provider  acetaminophen (TYLENOL) 325 MG tablet Take 650 mg by mouth every 6 (six) hours as needed.    [provider]  albuterol (PROVENTIL HFA;VENTOLIN HFA) 108 (90 Base) MCG/ACT inhaler Inhale 1-2 puffs into the lungs every 6 (six) hours as needed for wheezing or shortness of breath. 06/09/17   Nira Conn, MD  benzonatate (TESSALON) 100 MG capsule Take 1 capsule (100 mg total) by mouth 3 (three) times daily as needed for cough. 06/16/17   Petrucelli, Samantha R, PA-C  diphenhydrAMINE (BENADRYL) 25 MG tablet Take 50 mg by mouth every 6 (six) hours as needed.    [provider]  fluticasone (FLONASE) 50 MCG/ACT nasal spray Place 1 spray into both nostrils daily. 06/16/17   Petrucelli, Pleas Koch, PA-C  pediatric multivitamin-fluoride (POLY-VI-FLOR) 0.25 MG chewable tablet Chew 1 tablet by mouth daily.    [provider]  predniSONE (DELTASONE) 20 MG tablet 3 tabs po day one, then 2 po daily x 4 days 05/01/17   Rolan Bucco, MD  ranitidine (ZANTAC) 150 MG tablet Take 1 tablet  (150 mg total) by mouth 2 (two) times daily. 06/16/17   Petrucelli, Samantha R, PA-C  sulfamethoxazole-trimethoprim (BACTRIM DS,SEPTRA DS) 800-160 MG tablet Take 1 tablet by mouth 2 (two) times daily for 7 days. 11/11/17 11/18/17  Liberty Handy, PA-C  tetrahydrozoline 0.05 % ophthalmic solution Place 2 drops into both eyes daily as needed (ichy eyes).    [provider]    Family History No family history on file.  Social History Social History   Tobacco Use  . Smoking status: Current Every Day Smoker  . Smokeless tobacco: Current User  Substance Use Topics  . Alcohol use: Yes  . Drug use: No     Allergies   Penicillins   Review of Systems Review of Systems  Musculoskeletal:       Finger tip swelling pain and redess  All other systems reviewed and are negative.    Physical Exam Updated Vital Signs BP (!) 143/90 (BP Location: Right Arm)   Pulse 66   Temp 98.3 F (36.8 C) (Oral)   Resp 18   LMP 10/18/2017   SpO2 100%   Physical Exam  Constitutional: She is oriented to person, place, and time. She appears well-developed and well-nourished.  Non-toxic appearance.  HENT:  Head: Normocephalic.  Right Ear: External ear normal.  Left Ear: External ear normal.  Nose: Nose normal.  Eyes: Conjunctivae and EOM are normal.  Neck: Full passive range of motion without pain.  Cardiovascular: Normal  rate.  Pulmonary/Chest: Effort normal. No tachypnea. No respiratory distress.  Musculoskeletal: Normal range of motion.  No focal bony tenderness or fluctuance to right middle finger joints. Full AROM of fingers without pain.   Neurological: She is alert and oriented to person, place, and time.  Skin: Skin is warm and dry. Capillary refill takes less than 2 seconds.  Focal mild edema, erythema, tenderness and fluctuance to ulnar aspect of nail fold of right middle finger. No streaking or erythema, edema, warmth up into hand or arm.   Psychiatric: Her behavior is normal.  Thought content normal.     ED Treatments / Results  Labs (all labs ordered are listed, but only abnormal results are displayed) Labs Reviewed - No data to display  EKG None  Radiology Dg Finger Middle Left  Result Date: 11/11/2017 CLINICAL DATA:  Jammed left middle finger 3 weeks ago with persistent pain and swelling. EXAM: LEFT MIDDLE FINGER 2+V COMPARISON:  None. FINDINGS: There is no evidence of fracture or dislocation. There is no evidence of arthropathy or other focal bone abnormality. Soft tissues are unremarkable. IMPRESSION: Negative. Electronically Signed   By: Elberta Fortis M.D.   On: 11/11/2017 16:59    Procedures .Marland KitchenIncision and Drainage Date/Time: 11/11/2017 6:40 PM Performed by: Liberty Handy, PA-C Authorized by: Liberty Handy, PA-C   Consent:    Consent obtained:  Verbal   Consent given by:  Patient   Risks discussed:  Bleeding, incomplete drainage, pain and infection   Alternatives discussed:  Alternative treatment (warm compresses, antibiotics, re-evaluation) Location:    Indications for incision and drainage: paronychia    Location:  Upper extremity   Upper extremity location:  Finger   Finger location:  R long finger Pre-procedure details:    Skin preparation:  Betadine Anesthesia (see MAR for exact dosages):    Anesthesia method:  Local infiltration   Local anesthetic:  Lidocaine 1% w/o epi Procedure type:    Complexity:  Simple Procedure details:    Needle aspiration: no     Incision types:  Single straight   Scalpel blade:  11   Drainage:  Purulent   Drainage amount:  Moderate   Packing materials:  None Post-procedure details:    Patient tolerance of procedure:  Tolerated well, no immediate complications   (including critical care time)  Medications Ordered in ED Medications  lidocaine (PF) (XYLOCAINE) 1 % injection 30 mL (30 mLs Infiltration Given 11/11/17 1749)     Initial Impression / Assessment and Plan / ED Course  I have  reviewed the triage vital signs and the nursing notes.  Pertinent labs & imaging results that were available during my care of the patient were reviewed by me and considered in my medical decision making (see chart for details).    32 yo with focal fluctuance to right middle finger nail fold. No fevers, chills, IVDU, pain with AROM.  Septic arthritis, gout unlikely. Picture most consistent with uncomplicated paronychia. X-ray ordered at triage reviewed and negative. This was I7D in ED. Will dc with abx, warm soaks/masage. Discussed return precautions.   Final Clinical Impressions(s) / ED Diagnoses   Final diagnoses:  Paronychia of right middle finger    ED Discharge Orders        Ordered    sulfamethoxazole-trimethoprim (BACTRIM DS,SEPTRA DS) 800-160 MG tablet  2 times daily     11/11/17 1844       Jerrell Mylar 11/11/17 1845    Raeford Razor, MD  11/11/17 1905  

## 2017-11-11 NOTE — Discharge Instructions (Signed)
Take antibiotic. Take 1000 mg acetaminophen (tylenol) for pain. Can add 600 mg ibuprofen (aleve, advil) for more pain control. Warm soaks and massage until swelling, pain and drainage stops. Return for worsening swelling, redness, pain, swelling up arm or fevers.

## 2017-11-11 NOTE — ED Triage Notes (Signed)
Pt reports that she jammed left middle finger couple weeks reports still swollen and painful.

## 2018-01-02 ENCOUNTER — Emergency Department (HOSPITAL_COMMUNITY)
Admission: EM | Admit: 2018-01-02 | Discharge: 2018-01-02 | Disposition: A | Discharge status: Home / Self Care | Payer: Self-pay | Attending: Emergency Medicine | Admitting: Emergency Medicine

## 2018-01-02 ENCOUNTER — Encounter (HOSPITAL_COMMUNITY): Payer: Self-pay | Admitting: Emergency Medicine

## 2018-01-02 DIAGNOSIS — F17210 Nicotine dependence, cigarettes, uncomplicated: Secondary | ICD-10-CM | POA: Insufficient documentation

## 2018-01-02 DIAGNOSIS — Z79899 Other long term (current) drug therapy: Secondary | ICD-10-CM | POA: Insufficient documentation

## 2018-01-02 DIAGNOSIS — K0889 Other specified disorders of teeth and supporting structures: Secondary | ICD-10-CM

## 2018-01-02 DIAGNOSIS — K029 Dental caries, unspecified: Secondary | ICD-10-CM | POA: Insufficient documentation

## 2018-01-02 MED ORDER — TRAMADOL HCL 50 MG PO TABS: 50.0000 mg | ORAL_TABLET | Freq: Four times a day (QID) | ORAL | 0 refills | Status: DC | PRN

## 2018-01-02 MED ORDER — CLINDAMYCIN HCL 300 MG PO CAPS: 300.0000 mg | ORAL_CAPSULE | Freq: Three times a day (TID) | ORAL | 0 refills | Status: AC

## 2018-01-02 NOTE — ED Provider Notes (Signed)
[facility] EMERGENCY DEPARTMENT Provider Note   CSN: [id number] Arrival date & time: 01/02/18  0437     History   Chief Complaint Chief Complaint  Patient presents with  . Dental Pain    HPI Laurie Holland is a 32 y.o. female.  HPI  32 yo F with h/o poor dentition here with R lower dental pain. Pt reports 4-5 days of gradual onset progressively worsening aching, throbbing right lower dental pain. H/o recurrent cavities. She states she broke the tooth "a while ago" but feels like it's getting worse. Pain worse with eating, palpation, radiates toward her R ear and face. No alleviating factors. No fevers. No tongue swelling or difficulty swallowing. No change in phonation.  History reviewed. No pertinent past medical history.  There are no active problems to display for this patient.   History reviewed. No pertinent surgical history.   OB History   None      Home Medications    Prior to Admission medications   Medication Sig Start Date End Date Taking? Authorizing Provider  acetaminophen (TYLENOL) 325 MG tablet Take 650 mg by mouth every 6 (six) hours as needed.    [provider]  albuterol (PROVENTIL HFA;VENTOLIN HFA) 108 (90 Base) MCG/ACT inhaler Inhale 1-2 puffs into the lungs every 6 (six) hours as needed for wheezing or shortness of breath. 06/09/17   Nira Conn, MD  benzonatate (TESSALON) 100 MG capsule Take 1 capsule (100 mg total) by mouth 3 (three) times daily as needed for cough. 06/16/17   Petrucelli, Samantha R, PA-C  clindamycin (CLEOCIN) 300 MG capsule Take 1 capsule (300 mg total) by mouth 3 (three) times daily for 7 days. 01/02/18 01/09/18  Shaune Pollack, MD  diphenhydrAMINE (BENADRYL) 25 MG tablet Take 50 mg by mouth every 6 (six) hours as needed.    [provider]  fluticasone (FLONASE) 50 MCG/ACT nasal spray Place 1 spray into both nostrils daily. 06/16/17   Petrucelli, Pleas Koch, PA-C  pediatric  multivitamin-fluoride (POLY-VI-FLOR) 0.25 MG chewable tablet Chew 1 tablet by mouth daily.    [provider]  predniSONE (DELTASONE) 20 MG tablet 3 tabs po day one, then 2 po daily x 4 days 05/01/17   Rolan Bucco, MD  ranitidine (ZANTAC) 150 MG tablet Take 1 tablet (150 mg total) by mouth 2 (two) times daily. 06/16/17   Petrucelli, Samantha R, PA-C  tetrahydrozoline 0.05 % ophthalmic solution Place 2 drops into both eyes daily as needed (ichy eyes).    [provider]  traMADol (ULTRAM) 50 MG tablet Take 1 tablet (50 mg total) by mouth every 6 (six) hours as needed for severe pain. 01/02/18   Shaune Pollack, MD    Family History No family history on file.  Social History Social History   Tobacco Use  . Smoking status: Current Every Day Smoker  . Smokeless tobacco: Current User  Substance Use Topics  . Alcohol use: Yes  . Drug use: No     Allergies   Penicillins   Review of Systems Review of Systems  Constitutional: Negative for chills and fever.  HENT: Positive for dental problem. Negative for congestion, rhinorrhea and sore throat.   Eyes: Negative for visual disturbance.  Respiratory: Negative for cough, shortness of breath and wheezing.   Cardiovascular: Negative for chest pain and leg swelling.  Gastrointestinal: Negative for abdominal pain, diarrhea, nausea and vomiting.  Genitourinary: Negative for dysuria, flank pain, vaginal bleeding and vaginal discharge.  Musculoskeletal: Negative for  neck pain.  Skin: Negative for rash.  Allergic/Immunologic: Negative for immunocompromised state.  Neurological: Negative for syncope and headaches.  Hematological: Does not bruise/bleed easily.  All other systems reviewed and are negative.    Physical Exam Updated Vital Signs BP (!) 133/91   Pulse 73   Temp 98.4 F (36.9 C) (Oral)   Resp 12   LMP 12/02/2017 (Approximate)   SpO2 98%   Physical Exam  Constitutional: She is oriented to person, place,  and time. She appears well-developed and well-nourished. No distress.  HENT:  Head: Normocephalic and atraumatic.  Markedly poor dentition, with large, chronic appearing fx of right lower premolar, with gingival edema. No appreciable abscess. No sublingual edema or swelling. Tongue normal. No neck stiffness,.  Eyes: Conjunctivae are normal.  Neck: Neck supple.  Cardiovascular: Normal rate, regular rhythm and normal heart sounds. Exam reveals no friction rub.  No murmur heard. Pulmonary/Chest: Effort normal and breath sounds normal. No respiratory distress. She has no wheezes. She has no rales.  Abdominal: She exhibits no distension.  Musculoskeletal: She exhibits no edema.  Neurological: She is alert and oriented to person, place, and time. She exhibits normal muscle tone.  Skin: Skin is warm. Capillary refill takes less than 2 seconds.  Psychiatric: She has a normal mood and affect.  Nursing note and vitals reviewed.    ED Treatments / Results  Labs (all labs ordered are listed, but only abnormal results are displayed) Labs Reviewed - No data to display  EKG None  Radiology No results found.  Procedures Procedures (including critical care time)  Medications Ordered in ED Medications - No data to display   Initial Impression / Assessment and Plan / ED Course  I have reviewed the triage vital signs and the nursing notes.  Pertinent labs & imaging results that were available during my care of the patient were reviewed by me and considered in my medical decision making (see chart for details).     32 yo F here with uncomplicated dental infection, likely 2/2 active caries in fx tooth. No signs of Ludwig's or deep neck infection.  Patient is afebrile and hemodynamically stable.  No appreciable gingival or other oral abscess.  Will treat with antibiotics, analgesic, and dental referral.  Final Clinical Impressions(s) / ED Diagnoses   Final diagnoses:  Pain, dental  Dental  caries    ED Discharge Orders        Ordered    clindamycin (CLEOCIN) 300 MG capsule  3 times daily     01/02/18 0650    traMADol (ULTRAM) 50 MG tablet  Every 6 hours PRN     01/02/18 0650       Shaune PollackIsaacs, Emylie Amster, MD 01/02/18 [phone]

## 2018-01-02 NOTE — ED Triage Notes (Signed)
Pt reports right lower dental pain x5 days, reports taking multiple otc meds without relief. Hx of poor dentition per patient.

## 2018-01-02 NOTE — ED Notes (Signed)
Discharge instructions and prescriptions discussed with Pt. Pt verbalized understanding. Pt stable and ambulatory.   

## 2018-01-06 ENCOUNTER — Emergency Department (HOSPITAL_COMMUNITY)
Admission: EM | Admit: 2018-01-06 | Discharge: 2018-01-06 | Disposition: A | Discharge status: Home / Self Care | Payer: Self-pay | Attending: Emergency Medicine | Admitting: Emergency Medicine

## 2018-01-06 ENCOUNTER — Encounter (HOSPITAL_COMMUNITY): Payer: Self-pay | Admitting: Emergency Medicine

## 2018-01-06 DIAGNOSIS — F17200 Nicotine dependence, unspecified, uncomplicated: Secondary | ICD-10-CM | POA: Insufficient documentation

## 2018-01-06 DIAGNOSIS — Z79899 Other long term (current) drug therapy: Secondary | ICD-10-CM | POA: Insufficient documentation

## 2018-01-06 DIAGNOSIS — K0889 Other specified disorders of teeth and supporting structures: Secondary | ICD-10-CM | POA: Insufficient documentation

## 2018-01-06 DIAGNOSIS — K029 Dental caries, unspecified: Secondary | ICD-10-CM | POA: Insufficient documentation

## 2018-01-06 MED ORDER — ACETAMINOPHEN-CODEINE #3 300-30 MG PO TABS: 1.0000 | ORAL_TABLET | ORAL | 0 refills | Status: DC | PRN

## 2018-01-06 MED ORDER — IBUPROFEN 800 MG PO TABS: 800.0000 mg | ORAL_TABLET | Freq: Three times a day (TID) | ORAL | 0 refills | Status: DC | PRN

## 2018-01-06 NOTE — ED Triage Notes (Addendum)
Pt states taking clindamycin for dental infection, taking tramadol for pain with no relief to pain. States no insurance to go to dentist. Pt still taking antibiotic, has about 6 left.

## 2018-01-06 NOTE — ED Provider Notes (Signed)
[facility] EMERGENCY DEPARTMENT Provider Note   CSN: [id number] Arrival date & time: 01/06/18  1839     History   Chief Complaint Chief Complaint  Patient presents with  . Dental Pain    HPI Laurie Holland is a 32 y.o. female.  HPI Patient presents to the emergency department with right-sided dental pain that started 3 days ago.  The patient states she has a decayed molar on the right lower.  Patient states that she has had multiple issues like this in the past.  Patient states that she does not have a dentist currently.  Patient states she has not had any fevers, nausea, vomiting, throat swelling, mouth swelling, headache, blurred vision or syncope. History reviewed. No pertinent past medical history.  There are no active problems to display for this patient.   No past surgical history on file.   OB History   None      Home Medications    Prior to Admission medications   Medication Sig Start Date End Date Taking? Authorizing Provider  acetaminophen (TYLENOL) 325 MG tablet Take 650 mg by mouth every 6 (six) hours as needed.    [provider]  albuterol (PROVENTIL HFA;VENTOLIN HFA) 108 (90 Base) MCG/ACT inhaler Inhale 1-2 puffs into the lungs every 6 (six) hours as needed for wheezing or shortness of breath. 06/09/17   Nira Conn, MD  benzonatate (TESSALON) 100 MG capsule Take 1 capsule (100 mg total) by mouth 3 (three) times daily as needed for cough. 06/16/17   Petrucelli, Samantha R, PA-C  clindamycin (CLEOCIN) 300 MG capsule Take 1 capsule (300 mg total) by mouth 3 (three) times daily for 7 days. 01/02/18 01/09/18  Shaune Pollack, MD  diphenhydrAMINE (BENADRYL) 25 MG tablet Take 50 mg by mouth every 6 (six) hours as needed.    [provider]  fluticasone (FLONASE) 50 MCG/ACT nasal spray Place 1 spray into both nostrils daily. 06/16/17   Petrucelli, Pleas Koch, PA-C  pediatric multivitamin-fluoride (POLY-VI-FLOR) 0.25 MG  chewable tablet Chew 1 tablet by mouth daily.    [provider]  predniSONE (DELTASONE) 20 MG tablet 3 tabs po day one, then 2 po daily x 4 days 05/01/17   Rolan Bucco, MD  ranitidine (ZANTAC) 150 MG tablet Take 1 tablet (150 mg total) by mouth 2 (two) times daily. 06/16/17   Petrucelli, Samantha R, PA-C  tetrahydrozoline 0.05 % ophthalmic solution Place 2 drops into both eyes daily as needed (ichy eyes).    [provider]  traMADol (ULTRAM) 50 MG tablet Take 1 tablet (50 mg total) by mouth every 6 (six) hours as needed for severe pain. 01/02/18   Shaune Pollack, MD    Family History No family history on file.  Social History Social History   Tobacco Use  . Smoking status: Current Every Day Smoker  . Smokeless tobacco: Current User  Substance Use Topics  . Alcohol use: Yes  . Drug use: No     Allergies   Penicillins   Review of Systems Review of Systems All other systems negative except as documented in the HPI. All pertinent positives and negatives as reviewed in the HPI.  Physical Exam Updated Vital Signs BP (!) 158/101   Pulse 75   Temp 98.7 F (37.1 C)   Resp 19   LMP 11/29/2017   SpO2 100%   Physical Exam  Constitutional: She is oriented to person, place, and time. She appears well-developed and well-nourished. No distress.  HENT:  Head: Normocephalic and atraumatic.  Mouth/Throat:    Eyes: Pupils are equal, round, and reactive to light.  Pulmonary/Chest: Effort normal.  Neurological: She is alert and oriented to person, place, and time.  Skin: Skin is warm and dry.  Psychiatric: She has a normal mood and affect.  Nursing note and vitals reviewed.    ED Treatments / Results  Labs (all labs ordered are listed, but only abnormal results are displayed) Labs Reviewed - No data to display  EKG None  Radiology No results found.  Procedures Procedures (including critical care time)  Medications Ordered in ED Medications - No  data to display   Initial Impression / Assessment and Plan / ED Course  I have reviewed the triage vital signs and the nursing notes.  Pertinent labs & imaging results that were available during my care of the patient were reviewed by me and considered in my medical decision making (see chart for details).     I will refer the patient to the oral surgeon.  Have advised her to return here as needed.  Patient agrees the plan and all questions were answered.  I have advised her to continue taking the antibiotics.  Final Clinical Impressions(s) / ED Diagnoses   Final diagnoses:  None    ED Discharge Orders    None       Kyra MangesLawyer, Ife Vitelli, PA-C 01/06/18 Ninfa [id number]    Tilden Fossaees, Elizabeth, MD 01/09/18 1239

## 2018-01-06 NOTE — Discharge Instructions (Addendum)
Return here as needed.  Follow-up with the dentist provided.  Rinse with warm water peroxide 3 times a day.

## 2018-03-12 ENCOUNTER — Emergency Department (HOSPITAL_COMMUNITY)
Admission: EM | Admit: 2018-03-12 | Discharge: 2018-03-12 | Disposition: A | Discharge status: Home / Self Care | Payer: Self-pay | Attending: Emergency Medicine | Admitting: Emergency Medicine

## 2018-03-12 ENCOUNTER — Other Ambulatory Visit: Payer: Self-pay

## 2018-03-12 DIAGNOSIS — F17210 Nicotine dependence, cigarettes, uncomplicated: Secondary | ICD-10-CM | POA: Insufficient documentation

## 2018-03-12 DIAGNOSIS — Z79899 Other long term (current) drug therapy: Secondary | ICD-10-CM | POA: Insufficient documentation

## 2018-03-12 DIAGNOSIS — K029 Dental caries, unspecified: Secondary | ICD-10-CM | POA: Insufficient documentation

## 2018-03-12 MED ORDER — CLINDAMYCIN HCL 150 MG PO CAPS: 300.0000 mg | ORAL_CAPSULE | Freq: Three times a day (TID) | ORAL | 0 refills | Status: AC

## 2018-03-12 MED ORDER — CLINDAMYCIN HCL 150 MG PO CAPS
300.0000 mg | ORAL_CAPSULE | Freq: Once | ORAL | Status: AC
  Administered 2018-03-12: 300 mg via ORAL
  Filled 2018-03-12: qty 2

## 2018-03-12 MED ORDER — ACETAMINOPHEN 325 MG PO TABS
325.0000 mg | ORAL_TABLET | Freq: Once | ORAL | Status: AC
  Administered 2018-03-12: 325 mg via ORAL
  Filled 2018-03-12: qty 1

## 2018-03-12 MED ORDER — IBUPROFEN 400 MG PO TABS
600.0000 mg | ORAL_TABLET | Freq: Once | ORAL | Status: AC
  Administered 2018-03-12: 600 mg via ORAL
  Filled 2018-03-12: qty 1

## 2018-03-12 MED ORDER — IBUPROFEN 600 MG PO TABS: 600.0000 mg | ORAL_TABLET | Freq: Three times a day (TID) | ORAL | 0 refills | Status: DC | PRN

## 2018-03-12 MED ORDER — OXYCODONE-ACETAMINOPHEN 5-325 MG PO TABS
1.0000 | ORAL_TABLET | Freq: Once | ORAL | Status: AC
  Administered 2018-03-12: 1 via ORAL
  Filled 2018-03-12: qty 1

## 2018-03-12 NOTE — ED Provider Notes (Signed)
[facility] Provider Note   CSN: [id number] Arrival date & time: 03/12/18  0815     History   Chief Complaint Chief Complaint  Patient presents with  . Dental Pain    HPI Laurie Holland is a 32 y.o. female.  HPI Patient is a 32 year old female presents the emergency department with 1 to 2 weeks of worsening right lower dental pain with known dental caries.  She has not seen a dentist at this point.  She has not been on antibiotics.  She has tried over-the-counter medications without improvement in her symptoms.  Now she feels like she is having pain radiating from her right lower jaw into her head and down into her neck.  No difficulty breathing or swallowing.  Pain is moderate to severe in severity.  Last use of over-the-counter medications with Motrin last night with minimal improvement.   No past medical history on file.  There are no active problems to display for this patient.   No past surgical history on file.   OB History   None      Home Medications    Prior to Admission medications   Medication Sig Start Date End Date Taking? Authorizing Provider  acetaminophen (TYLENOL) 325 MG tablet Take 650 mg by mouth every 6 (six) hours as needed.    [provider]  acetaminophen-codeine (TYLENOL #3) 300-30 MG tablet Take 1 tablet by mouth every 4 (four) hours as needed for moderate pain. 01/06/18   Lawyer, Cristal Deerhristopher, PA-C  albuterol (PROVENTIL HFA;VENTOLIN HFA) 108 (90 Base) MCG/ACT inhaler Inhale 1-2 puffs into the lungs every 6 (six) hours as needed for wheezing or shortness of breath. 06/09/17   Nira Connardama, Pedro Eduardo, MD  benzonatate (TESSALON) 100 MG capsule Take 1 capsule (100 mg total) by mouth 3 (three) times daily as needed for cough. 06/16/17   Petrucelli, Samantha R, PA-C  clindamycin (CLEOCIN) 150 MG capsule Take 2 capsules (300 mg total) by mouth 3 (three) times daily for 7 days. 03/12/18 03/19/18  Azalia Bilisampos, Pallavi Clifton,  MD  diphenhydrAMINE (BENADRYL) 25 MG tablet Take 50 mg by mouth every 6 (six) hours as needed.    [provider]  fluticasone (FLONASE) 50 MCG/ACT nasal spray Place 1 spray into both nostrils daily. 06/16/17   Petrucelli, Samantha R, PA-C  ibuprofen (ADVIL,MOTRIN) 600 MG tablet Take 1 tablet (600 mg total) by mouth every 8 (eight) hours as needed. 03/12/18   Azalia Bilisampos, Nylani Michetti, MD  pediatric multivitamin-fluoride (POLY-VI-FLOR) 0.25 MG chewable tablet Chew 1 tablet by mouth daily.    [provider]  predniSONE (DELTASONE) 20 MG tablet 3 tabs po day one, then 2 po daily x 4 days 05/01/17   Rolan BuccoBelfi, Melanie, MD  ranitidine (ZANTAC) 150 MG tablet Take 1 tablet (150 mg total) by mouth 2 (two) times daily. 06/16/17   Petrucelli, Samantha R, PA-C  tetrahydrozoline 0.05 % ophthalmic solution Place 2 drops into both eyes daily as needed (ichy eyes).    [provider]  traMADol (ULTRAM) 50 MG tablet Take 1 tablet (50 mg total) by mouth every 6 (six) hours as needed for severe pain. 01/02/18   Shaune PollackIsaacs, Cameron, MD    Family History No family history on file.  Social History Social History   Tobacco Use  . Smoking status: Current Every Day Smoker  . Smokeless tobacco: Current User  Substance Use Topics  . Alcohol use: Yes  . Drug use: No     Allergies   Penicillins  Review of Systems Review of Systems  All other systems reviewed and are negative.    Physical Exam Updated Vital Signs BP (!) 143/90   Pulse 86   Temp 98 F (36.7 C) (Oral)   Resp 16   Ht 5\' 5"  (1.651 m)   Wt 90.7 kg   LMP 03/05/2018   SpO2 100%   BMI 33.28 kg/m   Physical Exam  Constitutional: She is oriented to person, place, and time. She appears well-developed and well-nourished.  HENT:  Head: Normocephalic.  Right lower first molar with obvious dental decay and dental tenderness.  No gingival swelling or fluctuance.  Posterior pharynx is normal.  Uvula is normal.  Tolerating secretions.   Oral airway patent.  Space under his tongue is soft.  Anterior neck is normal.  Eyes: EOM are normal.  Neck: Normal range of motion.  Pulmonary/Chest: Effort normal.  Abdominal: She exhibits no distension.  Musculoskeletal: Normal range of motion.  Neurological: She is alert and oriented to person, place, and time.  Psychiatric: She has a normal mood and affect.  Nursing note and vitals reviewed.    ED Treatments / Results  Labs (all labs ordered are listed, but only abnormal results are displayed) Labs Reviewed - No data to display  EKG None  Radiology No results found.  Procedures Procedures (including critical care time)  Medications Ordered in ED Medications  clindamycin (CLEOCIN) capsule 300 mg (has no administration in time range)  ibuprofen (ADVIL,MOTRIN) tablet 600 mg (has no administration in time range)  acetaminophen (TYLENOL) tablet 325 mg (has no administration in time range)  oxyCODONE-acetaminophen (PERCOCET/ROXICET) 5-325 MG per tablet 1 tablet (has no administration in time range)     Initial Impression / Assessment and Plan / ED Course  I have reviewed the triage vital signs and the nursing notes.  Pertinent labs & imaging results that were available during my care of the patient were reviewed by me and considered in my medical decision making (see chart for details).  Dental Pain. Home with antibiotics and pain medicine. Recommend dental follow up. No signs of gingival abscess. Tolerating secretions. Airway patent. No sub lingular swelling        Final Clinical Impressions(s) / ED Diagnoses   Final diagnoses:  Pain due to dental caries    ED Discharge Orders         Ordered    clindamycin (CLEOCIN) 150 MG capsule  3 times daily     03/12/18 0856    ibuprofen (ADVIL,MOTRIN) 600 MG tablet  Every 8 hours PRN     03/12/18 0856           Azalia Bilis, MD 03/12/18 [phone]

## 2018-03-12 NOTE — ED Triage Notes (Signed)
Pt arrives to ED with right lower dental pain with dental caries. Pt also has a headache from dental pain. Used Motrin last night with some relief.

## 2018-03-12 NOTE — Discharge Instructions (Addendum)
We do not have a dentist on call  Please see the dental resource list provided  Please follow up with a dentist

## 2018-05-12 ENCOUNTER — Emergency Department (HOSPITAL_COMMUNITY)
Admission: EM | Admit: 2018-05-12 | Discharge: 2018-05-12 | Disposition: A | Discharge status: Home / Self Care | Payer: Self-pay | Attending: Emergency Medicine | Admitting: Emergency Medicine

## 2018-05-12 ENCOUNTER — Other Ambulatory Visit: Payer: Self-pay

## 2018-05-12 ENCOUNTER — Emergency Department (HOSPITAL_COMMUNITY): Payer: Self-pay

## 2018-05-12 ENCOUNTER — Encounter (HOSPITAL_COMMUNITY): Payer: Self-pay | Admitting: Emergency Medicine

## 2018-05-12 DIAGNOSIS — F1290 Cannabis use, unspecified, uncomplicated: Secondary | ICD-10-CM | POA: Insufficient documentation

## 2018-05-12 DIAGNOSIS — R079 Chest pain, unspecified: Secondary | ICD-10-CM | POA: Insufficient documentation

## 2018-05-12 DIAGNOSIS — R000 Tachycardia, unspecified: Secondary | ICD-10-CM

## 2018-05-12 DIAGNOSIS — F17210 Nicotine dependence, cigarettes, uncomplicated: Secondary | ICD-10-CM | POA: Insufficient documentation

## 2018-05-12 DIAGNOSIS — Z79899 Other long term (current) drug therapy: Secondary | ICD-10-CM | POA: Insufficient documentation

## 2018-05-12 DIAGNOSIS — R002 Palpitations: Secondary | ICD-10-CM | POA: Insufficient documentation

## 2018-05-12 LAB — BASIC METABOLIC PANEL
Anion gap: 13 (ref 5–15)
BUN: 5 mg/dL — ABNORMAL LOW (ref 6–20)
CALCIUM: 9.6 mg/dL (ref 8.9–10.3)
CHLORIDE: 104 mmol/L (ref 98–111)
CO2: 22 mmol/L (ref 22–32)
CREATININE: 0.74 mg/dL (ref 0.44–1.00)
GFR calc non Af Amer: >60 mL/min (ref >60)
Glucose, Bld: 83 mg/dL (ref 70–99)
Potassium: 4.1 mmol/L (ref 3.5–5.1)
SODIUM: 139 mmol/L (ref 135–145)

## 2018-05-12 LAB — CBC WITH DIFFERENTIAL/PLATELET
Abs Immature Granulocytes: 0.03 10*3/uL (ref 0.00–0.07)
BASOS ABS: 0 10*3/uL (ref 0.0–0.1)
BASOS PCT: 0 %
EOS PCT: 1 %
Eosinophils Absolute: 0.1 10*3/uL (ref 0.0–0.5)
HCT: 41.1 % (ref 36.0–46.0)
HEMOGLOBIN: 12.5 g/dL (ref 12.0–15.0)
Immature Granulocytes: 0 %
LYMPHS PCT: 24 %
Lymphs Abs: 2.3 10*3/uL (ref 0.7–4.0)
MCH: 29.6 pg (ref 26.0–34.0)
MCHC: 30.4 g/dL (ref 30.0–36.0)
MCV: 97.2 fL (ref 80.0–100.0)
Monocytes Absolute: 0.5 10*3/uL (ref 0.1–1.0)
Monocytes Relative: 5 %
NRBC: 0 % (ref 0.0–0.2)
Neutro Abs: 6.4 10*3/uL (ref 1.7–7.7)
Neutrophils Relative %: 70 %
PLATELETS: 234 10*3/uL (ref 150–400)
RBC: 4.23 MIL/uL (ref 3.87–5.11)
RDW: 14.4 % (ref 11.5–15.5)
WBC: 9.3 10*3/uL (ref 4.0–10.5)

## 2018-05-12 LAB — I-STAT TROPONIN, ED: TROPONIN I, POC: 0.02 ng/mL (ref 0.00–0.08)

## 2018-05-12 LAB — I-STAT BETA HCG BLOOD, ED (MC, WL, AP ONLY): I-stat hCG, quantitative: <5 m[IU]/mL (ref <5)

## 2018-05-12 LAB — D-DIMER, QUANTITATIVE: D-Dimer, Quant: 0.43 ug/mL-FEU (ref 0.00–0.50)

## 2018-05-12 NOTE — ED Notes (Signed)
Pt ambulated in hall with steady gait.

## 2018-05-12 NOTE — Discharge Instructions (Signed)
Please take Tylenol (acetaminophen) to relieve your pain.  You may take tylenol, up to 1,000 mg (two extra strength pills).  Do not take more than 3,000 mg tylenol in a 24 hour period.  Please check all medication labels as many medications such as pain and cold medications may contain tylenol. Please do not drink alcohol while taking this medication.   Please stop smoking marijuana.

## 2018-05-12 NOTE — ED Provider Notes (Signed)
[facility] EMERGENCY DEPARTMENT Provider Note   CSN: [id number] Arrival date & time: 05/12/18  1412     History   Chief Complaint Chief Complaint  Patient presents with  . Palpitations  . Dizziness    HPI Laurie Holland is a 32 y.o. female who presents today for evaluation of palpitations and dizziness.  Her symptoms started ultimately 20 minutes after she smoked marijuana.  She says that she has previously obtained marijuana from the same person without difficulties in the past.  With EMS her heart rate was reportedly quite elevated above 150, she was given 50 cc of fluid prior to arrival.  Ports intermittent left-sided chest pain lasts for a few seconds before going away.  She reports generally feeling lightheaded.  Does report leg swelling recently.  No personal history of blood clots.  She has had a cough recently.  No fevers or chills.   HPI  History reviewed. No pertinent past medical history.  There are no active problems to display for this patient.   History reviewed. No pertinent surgical history.   OB History   None      Home Medications    Prior to Admission medications   Medication Sig Start Date End Date Taking? Authorizing Provider  acetaminophen (TYLENOL) 325 MG tablet Take 650 mg by mouth every 6 (six) hours as needed.    [provider]  acetaminophen-codeine (TYLENOL #3) 300-30 MG tablet Take 1 tablet by mouth every 4 (four) hours as needed for moderate pain. 01/06/18   Lawyer, Cristal Deer, PA-C  albuterol (PROVENTIL HFA;VENTOLIN HFA) 108 (90 Base) MCG/ACT inhaler Inhale 1-2 puffs into the lungs every 6 (six) hours as needed for wheezing or shortness of breath. 06/09/17   Nira Conn, MD  benzonatate (TESSALON) 100 MG capsule Take 1 capsule (100 mg total) by mouth 3 (three) times daily as needed for cough. 06/16/17   Petrucelli, Samantha R, PA-C  diphenhydrAMINE (BENADRYL) 25 MG tablet Take 50 mg by mouth every 6  (six) hours as needed.    [provider]  fluticasone (FLONASE) 50 MCG/ACT nasal spray Place 1 spray into both nostrils daily. 06/16/17   Petrucelli, Samantha R, PA-C  ibuprofen (ADVIL,MOTRIN) 600 MG tablet Take 1 tablet (600 mg total) by mouth every 8 (eight) hours as needed. 03/12/18   Azalia Bilis, MD  pediatric multivitamin-fluoride (POLY-VI-FLOR) 0.25 MG chewable tablet Chew 1 tablet by mouth daily.    [provider]  predniSONE (DELTASONE) 20 MG tablet 3 tabs po day one, then 2 po daily x 4 days 05/01/17   Rolan Bucco, MD  ranitidine (ZANTAC) 150 MG tablet Take 1 tablet (150 mg total) by mouth 2 (two) times daily. 06/16/17   Petrucelli, Samantha R, PA-C  tetrahydrozoline 0.05 % ophthalmic solution Place 2 drops into both eyes daily as needed (ichy eyes).    [provider]  traMADol (ULTRAM) 50 MG tablet Take 1 tablet (50 mg total) by mouth every 6 (six) hours as needed for severe pain. 01/02/18   Shaune Pollack, MD    Family History History reviewed. No pertinent family history.  Social History Social History   Tobacco Use  . Smoking status: Current Every Day Smoker  . Smokeless tobacco: Current User  Substance Use Topics  . Alcohol use: Yes  . Drug use: No     Allergies   Penicillins   Review of Systems Review of Systems  Constitutional: Negative for chills and fever.  Respiratory: Positive for shortness  of breath.   Cardiovascular: Positive for chest pain and palpitations (Feeling like heart is beating fast).  Gastrointestinal: Negative for abdominal pain, diarrhea, nausea and vomiting.  Neurological: Positive for light-headedness.  All other systems reviewed and are negative.    Physical Exam Updated Vital Signs BP 135/89 (BP Location: Right Arm)   Pulse 91   Resp 15   Ht 5\' 5"  (1.651 m)   Wt 99.8 kg   SpO2 100%   BMI 36.61 kg/m   Physical Exam  Constitutional: She is oriented to person, place, and time. She appears  well-developed and well-nourished. No distress.  HENT:  Head: Normocephalic and atraumatic.  Mouth/Throat: Oropharynx is clear and moist.  Eyes: Conjunctivae are normal.  Neck: Normal range of motion. Neck supple.  Cardiovascular: Normal rate, regular rhythm, normal heart sounds and intact distal pulses.  No murmur heard. Pulmonary/Chest: Effort normal and breath sounds normal. No stridor. No respiratory distress. She exhibits no tenderness.  Abdominal: Soft. Bowel sounds are normal. She exhibits no distension. There is no tenderness. There is no guarding.  Musculoskeletal: She exhibits no edema.  Neurological: She is alert and oriented to person, place, and time.  Skin: Skin is warm and dry. She is not diaphoretic.  Psychiatric: She has a normal mood and affect.  Nursing note and vitals reviewed.    ED Treatments / Results  Labs (all labs ordered are listed, but only abnormal results are displayed) Labs Reviewed  BASIC METABOLIC PANEL - Abnormal; Notable for the following components:      Result Value   BUN 5 (*)    All other components within normal limits  CBC WITH DIFFERENTIAL/PLATELET  D-DIMER, QUANTITATIVE (NOT AT [facility])  I-STAT BETA HCG BLOOD, ED ([facility], [facility], [facility] ONLY)  I-STAT TROPONIN, ED    EKG None  Radiology Dg Chest 2 View  Result Date: 05/12/2018 CLINICAL DATA:  Palpitations and dizziness. EXAM: CHEST - 2 VIEW COMPARISON:  April 22, 2017 FINDINGS: The heart size and mediastinal contours are within normal limits. Both lungs are clear. The visualized skeletal structures are unremarkable. IMPRESSION: No active cardiopulmonary disease. Electronically Signed   By: Gerome Sam III M.D   On: 05/12/2018 18:03    Procedures Procedures (including critical care time)  Medications Ordered in ED Medications - No data to display   Initial Impression / Assessment and Plan / ED Course  I have reviewed the triage vital signs and the nursing notes.  Pertinent labs &  imaging results that were available during my care of the patient were reviewed by me and considered in my medical decision making (see chart for details).    Patient presents today for evaluation of chest pain, rapid heart rate, and shortness of breath that occurred after smoking marijuana.  She is unsure if it might of been laced with anything else.  Upon arrival her heart rate was still slightly elevated in the low 100s, therefore unable to apply PERC criteria.  D-dimer was obtained that was not elevated.  EKG obtained without evidence of ischemia or acute abnormalities.  X-ray did not show evidence of consolidation, pneumothorax, or other acute changes.  Labs were obtained and reviewed, patient is not anemic, she does not have a leukocytosis.  She does not have any significant electrolyte disturbances.  Troponin was obtained was not elevated.  Patient is not pregnant.  Suspect that her marijuana may have been contaminated with another substance.  Given normal-appearing EKG, resolution of symptoms, and normal troponin symptoms are  not consistent with ACS.  Was observed in the emergency room for multiple hours without worsening condition.  She requested to be discharged home.  Return precautions were discussed with patient who states their understanding.  At the time of discharge patient denied any unaddressed complaints or concerns.  Patient is agreeable for discharge home.   Final Clinical Impressions(s) / ED Diagnoses   Final diagnoses:  Rapid heart rate  Chest pain, unspecified type  Marijuana smoker    ED Discharge Orders    None       Norman Clay 05/12/18 2258    Bethann Berkshire, MD 05/14/18 [phone]

## 2018-05-12 NOTE — ED Triage Notes (Signed)
Pt to ER for evaluation of palpitations and dizziness after smoking marijuana one hour ago. Given 500 cc in route. Initial HR 160, after fluids, down to 100. Ambulatory.

## 2018-05-12 NOTE — ED Notes (Signed)
Pt stable and ambulatory for discharge, states understanding follow up.  

## 2018-05-12 NOTE — ED Notes (Signed)
Patient transported to X-ray 

## 2018-09-16 ENCOUNTER — Other Ambulatory Visit: Payer: Self-pay

## 2018-09-16 ENCOUNTER — Ambulatory Visit (HOSPITAL_COMMUNITY)
Admission: EM | Admit: 2018-09-16 | Discharge: 2018-09-16 | Disposition: A | Discharge status: Home / Self Care | Payer: Self-pay | Attending: Internal Medicine | Admitting: Internal Medicine

## 2018-09-16 ENCOUNTER — Encounter (HOSPITAL_COMMUNITY): Payer: Self-pay

## 2018-09-16 DIAGNOSIS — K0889 Other specified disorders of teeth and supporting structures: Secondary | ICD-10-CM

## 2018-09-16 MED ORDER — HYDROCODONE-ACETAMINOPHEN 5-325 MG PO TABS
ORAL_TABLET | ORAL | Status: AC
  Filled 2018-09-16: qty 1

## 2018-09-16 MED ORDER — NAPROXEN 500 MG PO TABS: 500.0000 mg | ORAL_TABLET | Freq: Two times a day (BID) | ORAL | 0 refills | Status: DC

## 2018-09-16 MED ORDER — HYDROCODONE-ACETAMINOPHEN 5-325 MG PO TABS
1.0000 | ORAL_TABLET | Freq: Once | ORAL | Status: AC
  Administered 2018-09-16: 1 via ORAL

## 2018-09-16 MED ORDER — CLINDAMYCIN HCL 150 MG PO CAPS: 450.0000 mg | ORAL_CAPSULE | Freq: Three times a day (TID) | ORAL | 0 refills | Status: AC

## 2018-09-16 NOTE — ED Provider Notes (Signed)
[facility]    CSN: [id number] Arrival date & time: 09/16/18  1501     History   Chief Complaint Chief Complaint  Patient presents with  . Dental Pain    HPI Laurie Holland is a 33 y.o. female.   Laurie Holland presents with complaints of right lower dental pain. The tooth chipped and broke approximately 2-3 months ago and has increasingly bothered her. Over the past 2 weeks pain has increased. Swelling has waxed and waned. No fever. No specific drainage. Has been taking excedrin tylenol which have minimally helped. Has had other teeth pulled for similar. States she doesn't have insurance therefore hasn't been able to see a dentist. Isn't taking any medications currently.     ROS per HPI.      History reviewed. No pertinent past medical history.  There are no active problems to display for this patient.   History reviewed. No pertinent surgical history.  OB History   No obstetric history on file.      Home Medications    Prior to Admission medications   Medication Sig Start Date End Date Taking? Authorizing Provider  acetaminophen (TYLENOL) 325 MG tablet Take 650 mg by mouth every 6 (six) hours as needed.    [provider]  acetaminophen-codeine (TYLENOL #3) 300-30 MG tablet Take 1 tablet by mouth every 4 (four) hours as needed for moderate pain. 01/06/18   Lawyer, Cristal Deer, PA-C  albuterol (PROVENTIL HFA;VENTOLIN HFA) 108 (90 Base) MCG/ACT inhaler Inhale 1-2 puffs into the lungs every 6 (six) hours as needed for wheezing or shortness of breath. 06/09/17   Nira Conn, MD  benzonatate (TESSALON) 100 MG capsule Take 1 capsule (100 mg total) by mouth 3 (three) times daily as needed for cough. 06/16/17   Petrucelli, Samantha R, PA-C  clindamycin (CLEOCIN) 150 MG capsule Take 3 capsules (450 mg total) by mouth 3 (three) times daily for 7 days. 09/16/18 09/23/18  Georgetta Haber, NP  diphenhydrAMINE (BENADRYL) 25 MG tablet Take 50 mg by mouth  every 6 (six) hours as needed.    [provider]  fluticasone (FLONASE) 50 MCG/ACT nasal spray Place 1 spray into both nostrils daily. 06/16/17   Petrucelli, Samantha R, PA-C  ibuprofen (ADVIL,MOTRIN) 600 MG tablet Take 1 tablet (600 mg total) by mouth every 8 (eight) hours as needed. 03/12/18   Azalia Bilis, MD  naproxen (NAPROSYN) 500 MG tablet Take 1 tablet (500 mg total) by mouth 2 (two) times daily. 09/16/18   Georgetta Haber, NP  pediatric multivitamin-fluoride (POLY-VI-FLOR) 0.25 MG chewable tablet Chew 1 tablet by mouth daily.    [provider]  predniSONE (DELTASONE) 20 MG tablet 3 tabs po day one, then 2 po daily x 4 days 05/01/17   Rolan Bucco, MD  ranitidine (ZANTAC) 150 MG tablet Take 1 tablet (150 mg total) by mouth 2 (two) times daily. 06/16/17   Petrucelli, Samantha R, PA-C  tetrahydrozoline 0.05 % ophthalmic solution Place 2 drops into both eyes daily as needed (ichy eyes).    [provider]  traMADol (ULTRAM) 50 MG tablet Take 1 tablet (50 mg total) by mouth every 6 (six) hours as needed for severe pain. 01/02/18   Shaune Pollack, MD    Family History No family history on file.  Social History Social History   Tobacco Use  . Smoking status: Current Every Day Smoker  . Smokeless tobacco: Current User  Substance Use Topics  . Alcohol use: Yes  . Drug use:  No     Allergies   Penicillins   Review of Systems Review of Systems   Physical Exam Triage Vital Signs ED Triage Vitals  Enc Vitals Group     BP 09/16/18 1545 131/81     Pulse Rate 09/16/18 1545 88     Resp 09/16/18 1545 (!) 95     Temp 09/16/18 1545 98 F (36.7 C)     Temp src --      SpO2 09/16/18 1545 100 %     Weight 09/16/18 1548 225 lb (102.1 kg)     Height --      Head Circumference --      Peak Flow --      Pain Score 09/16/18 1548 9     Pain Loc --      Pain Edu? --      Excl. in GC? --    No data found.  Updated Vital Signs BP 131/81 (BP Location: Right  Arm)   Pulse 88   Temp 98 F (36.7 C)   Resp (!) 95   Wt 225 lb (102.1 kg)   LMP 08/31/2018   SpO2 100%   BMI 37.44 kg/m   Visual Acuity Right Eye Distance:   Left Eye Distance:   Bilateral Distance:    Right Eye Near:   Left Eye Near:    Bilateral Near:     Physical Exam Constitutional:      General: She is not in acute distress.    Appearance: She is well-developed.  HENT:     Head: Normocephalic and atraumatic.     Mouth/Throat:     Lips: Pink.     Mouth: Mucous membranes are moist.     Dentition: Abnormal dentition. Dental tenderness and dental caries present. No gingival swelling or dental abscesses.     Comments: Pain and tenderness at tooth #29; no visible abscess; no soft tissue swelling  Cardiovascular:     Rate and Rhythm: Normal rate and regular rhythm.     Heart sounds: Normal heart sounds.  Pulmonary:     Effort: Pulmonary effort is normal.     Breath sounds: Normal breath sounds.  Skin:    General: Skin is warm and dry.  Neurological:     Mental Status: She is alert and oriented to person, place, and time.      UC Treatments / Results  Labs (all labs ordered are listed, but only abnormal results are displayed) Labs Reviewed - No data to display  EKG None  Radiology No results found.  Procedures Procedures (including critical care time)  Medications Ordered in UC Medications  HYDROcodone-acetaminophen (NORCO/VICODIN) 5-325 MG per tablet 1 tablet (has no administration in time range)    Initial Impression / Assessment and Plan / UC Course  I have reviewed the triage vital signs and the nursing notes.  Pertinent labs & imaging results that were available during my care of the patient were reviewed by me and considered in my medical decision making (see chart for details).     Patient was brought in by a friend, hydrocodone given in clinic and sent with scripts for nsaids. Clindamycin provided. Encouraged follow up with dentist for  definitive treatment. Patient verbalized understanding and agreeable to plan.    Final Clinical Impressions(s) / UC Diagnoses   Final diagnoses:  Pain, dental     Discharge Instructions     Complete course of antibiotics.  Naproxen twice a day, take with food.  Please  follow up with dentist for definitive treatment.    ED Prescriptions    Medication Sig Dispense Auth. Provider   clindamycin (CLEOCIN) 150 MG capsule Take 3 capsules (450 mg total) by mouth 3 (three) times daily for 7 days. 63 capsule Lukah Goswami B, NP   naproxen (NAPROSYN) 500 MG tablet Take 1 tablet (500 mg total) by mouth 2 (two) times daily. 30 tablet Georgetta Haber, NP     Controlled Substance Prescriptions [place] Controlled Substance Registry consulted? Not Applicable   Georgetta Haber, NP 09/16/18 [phone]

## 2018-09-16 NOTE — Discharge Instructions (Signed)
Complete course of antibiotics.  Naproxen twice a day, take with food.  Please follow up with dentist for definitive treatment.

## 2018-09-16 NOTE — ED Triage Notes (Signed)
Pt cc dental pain x 2 weeks or more.

## 2019-07-10 ENCOUNTER — Ambulatory Visit (HOSPITAL_COMMUNITY)
Admission: EM | Admit: 2019-07-10 | Discharge: 2019-07-10 | Disposition: A | Discharge status: Home / Self Care | Payer: Self-pay | Attending: Family Medicine | Admitting: Family Medicine

## 2019-07-10 ENCOUNTER — Encounter (HOSPITAL_COMMUNITY): Payer: Self-pay

## 2019-07-10 ENCOUNTER — Other Ambulatory Visit: Payer: Self-pay

## 2019-07-10 DIAGNOSIS — L0231 Cutaneous abscess of buttock: Secondary | ICD-10-CM

## 2019-07-10 NOTE — ED Triage Notes (Signed)
Pt presents for wound re check to have packing checked from a abscess that was lanced 2 days ago.

## 2019-07-15 NOTE — ED Provider Notes (Signed)
[facility]   [id number] 07/10/19 Arrival Time: 2426  ASSESSMENT & PLAN:  1. Abscess of buttock, right     No indication to expand I&D excision; still with slight opaque drainage. Still tender over surrounding skin. No significant surrounding warmth. Continue current care. OTC analgesics as needed. WBAT.   Follow-up Information    [facility].   Specialty: Urgent Care Why: If worsening or failing to improve as anticipated. Contact information: [place] [phone]           Reviewed expectations re: course of current medical issues. Questions answered. Outlined signs and symptoms indicating need for more acute intervention. Patient verbalized understanding. After Visit Summary given.   SUBJECTIVE:  Laurie Holland is a 33 y.o. female who reports having "a boil lanced" two days ago. Lower R buttock. Feels the packing fell out. Some scant drainage. Pain not worsening. Taking medications as directed. Afebrile. Ambulatory without difficulty.  ROS: As per HPI.  OBJECTIVE:  Vitals:   07/10/19 1554  BP: (!) 142/85  Pulse: 99  Resp: 18  Temp: 98.5 F (36.9 C)  TempSrc: Oral  SpO2: 100%     General appearance: alert; no distress Skin: R lower buttock I&D site appears good; scant opaque drainage; tender to touch; no bleeding; packing not present Psychological: alert and cooperative; normal mood and affect  Allergies  Allergen Reactions  . Penicillins     Has patient had a PCN reaction causing immediate rash, facial/tongue/throat swelling, SOB or lightheadedness with hypotension: No Has patient had a PCN reaction causing severe rash involving mucus membranes or skin necrosis: No Has patient had a PCN reaction that required hospitalization: YES Has patient had a PCN reaction occurring within the last 10 years: No If all of the above answers are "NO", then may proceed with  Cephalosporin use.    PMH: "Healthy".  Social History   Socioeconomic History  . Marital status: Single    Spouse name: Not on file  . Number of children: Not on file  . Years of education: Not on file  . Highest education level: Not on file  Occupational History  . Not on file  Tobacco Use  . Smoking status: Current Every Day Smoker  . Smokeless tobacco: Current User  Substance and Sexual Activity  . Alcohol use: Yes  . Drug use: No  . Sexual activity: Yes  Other Topics Concern  . Not on file  Social History Narrative  . Not on file   Social Determinants of Health   Financial Resource Strain:   . Difficulty of Paying Living Expenses: Not on file  Food Insecurity:   . Worried About Charity fundraiser in the Last Year: Not on file  . Ran Out of Food in the Last Year: Not on file  Transportation Needs:   . Lack of Transportation (Medical): Not on file  . Lack of Transportation (Non-Medical): Not on file  Physical Activity:   . Days of Exercise per Week: Not on file  . Minutes of Exercise per Session: Not on file  Stress:   . Feeling of Stress : Not on file  Social Connections:   . Frequency of Communication with Friends and Family: Not on file  . Frequency of Social Gatherings with Friends and Family: Not on file  . Attends Religious Services: Not on file  . Active Member of Clubs or Organizations: Not on file  . Attends Club  or Organization Meetings: Not on file  . Marital Status: Not on file   Family History  Family history unknown: Yes   History reviewed. No pertinent surgical history.         Mardella Layman, MD 07/15/19 1200

## 2020-02-28 IMAGING — CR DG FINGER MIDDLE 2+V*L*
3 series · 3 of 3 positions shown · non-contrast
Comparison: None.

CLINICAL DATA: Jammed left middle finger 3 weeks ago with
persistent pain and swelling.

EXAM:
LEFT MIDDLE FINGER 2+V

[x finger pa left]
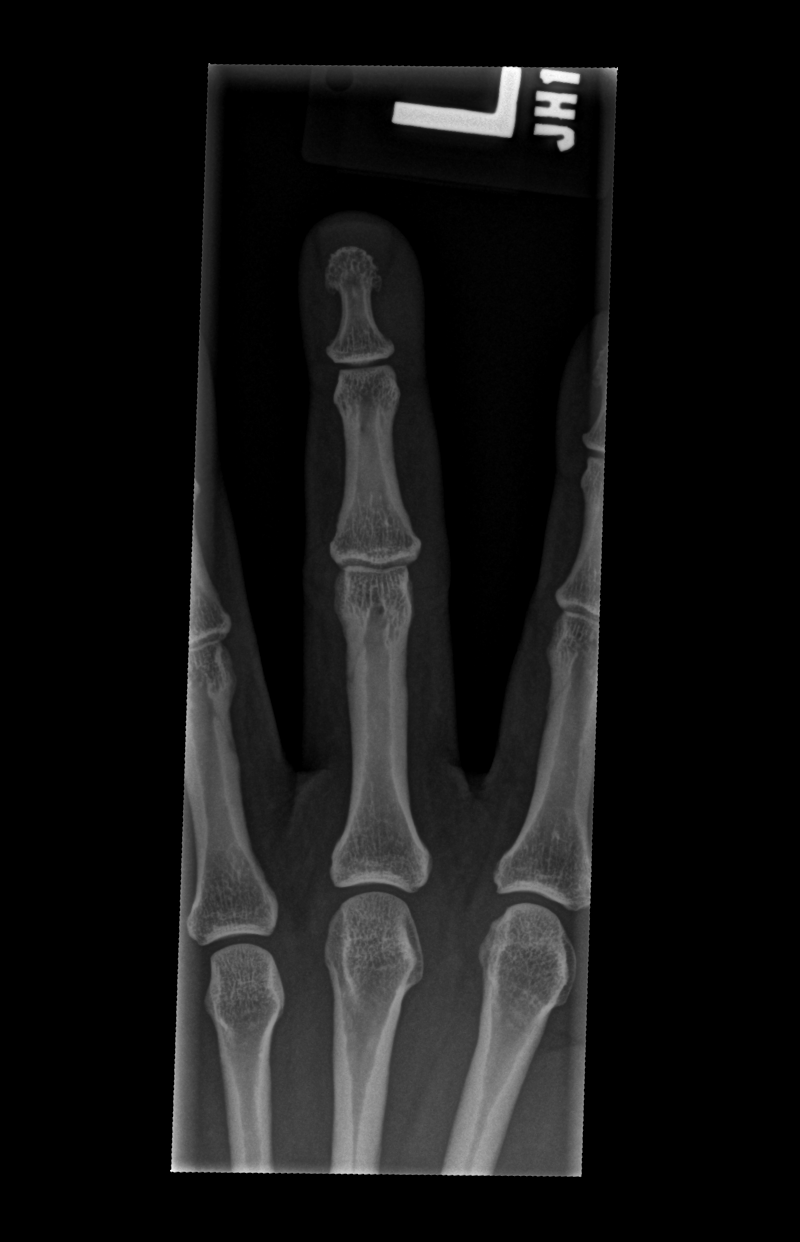

[x finger obl left]
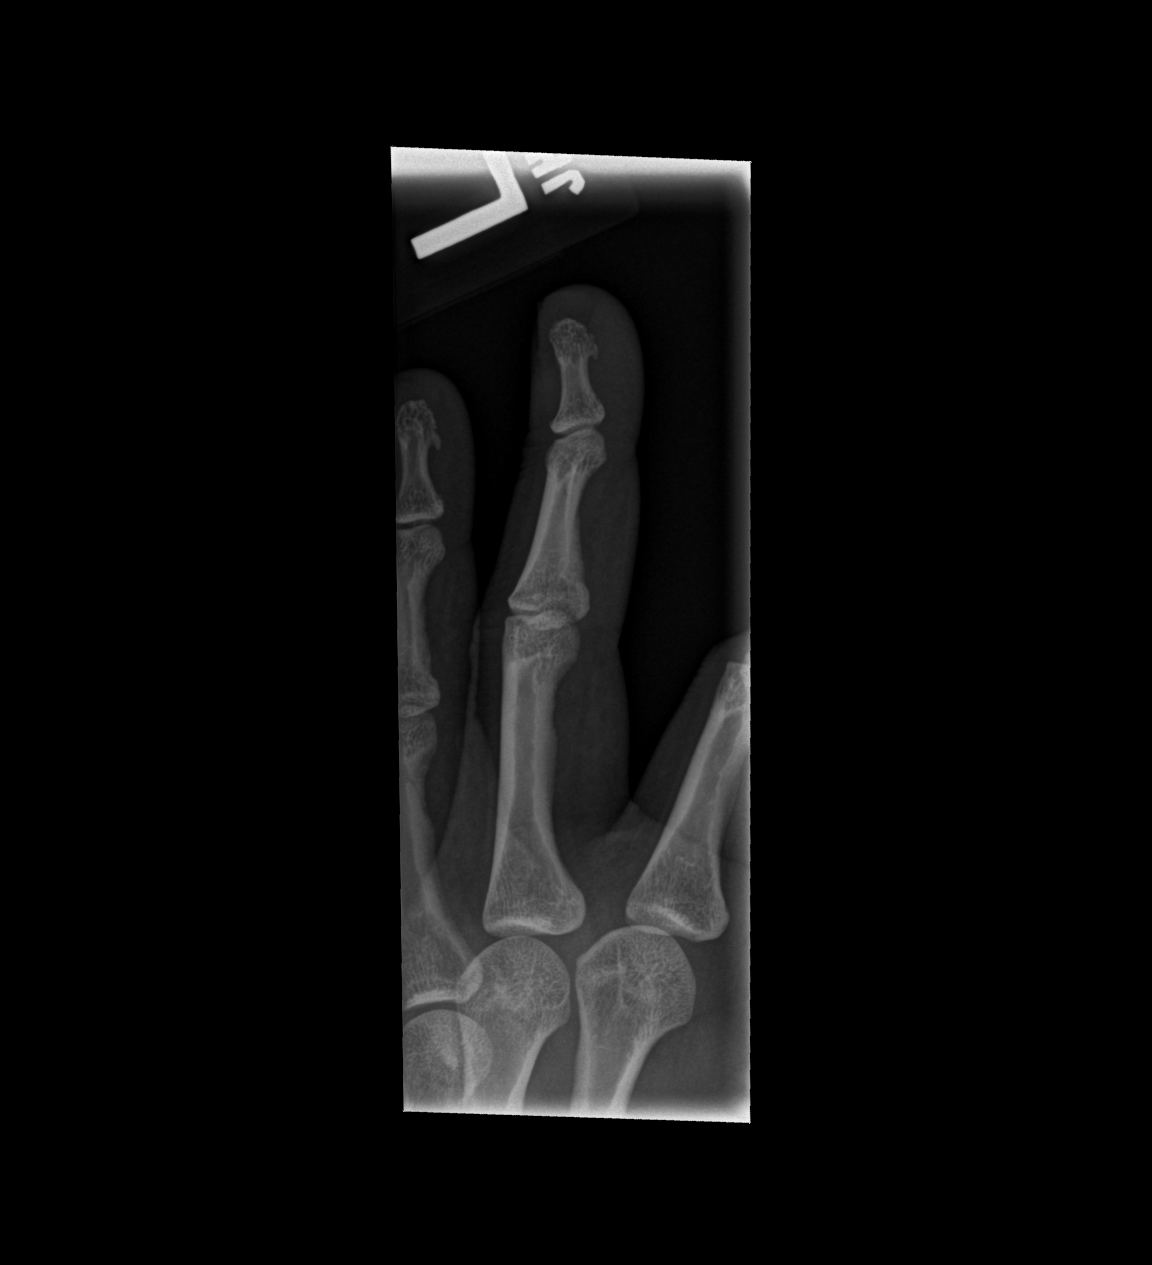

[x finger lat left]
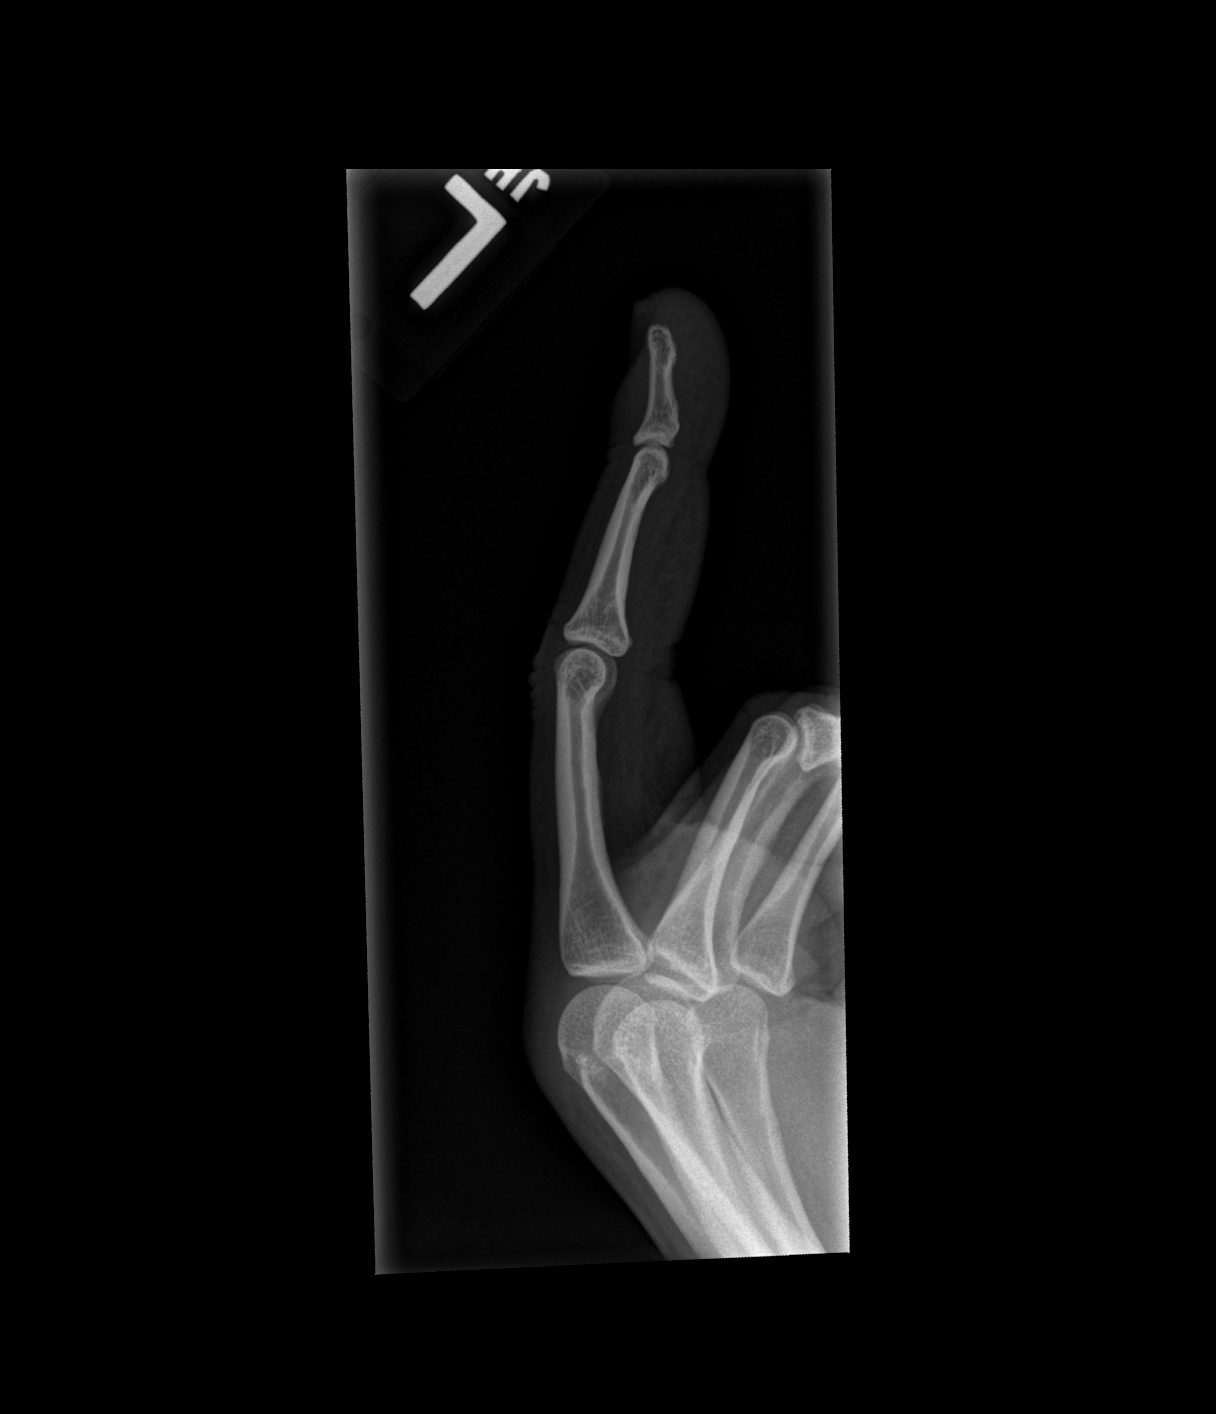

[3 of 3 positions shown; findings below may reference images not displayed]

FINDINGS: There is no evidence of fracture or dislocation. There is no
evidence of arthropathy or other focal bone abnormality. Soft
tissues are unremarkable.
IMPRESSION: Negative.

## 2020-04-16 ENCOUNTER — Other Ambulatory Visit: Payer: Self-pay

## 2020-04-16 ENCOUNTER — Encounter (HOSPITAL_COMMUNITY): Payer: Self-pay

## 2020-04-16 ENCOUNTER — Ambulatory Visit (HOSPITAL_COMMUNITY)
Admission: EM | Admit: 2020-04-16 | Discharge: 2020-04-16 | Disposition: A | Discharge status: Home / Self Care | Payer: Self-pay | Attending: Family Medicine | Admitting: Family Medicine

## 2020-04-16 DIAGNOSIS — L0231 Cutaneous abscess of buttock: Secondary | ICD-10-CM

## 2020-04-16 MED ORDER — DOXYCYCLINE HYCLATE 100 MG PO CAPS: 100.0000 mg | ORAL_CAPSULE | Freq: Two times a day (BID) | ORAL | 0 refills | Status: DC

## 2020-04-16 MED ORDER — HYDROCODONE-ACETAMINOPHEN 5-325 MG PO TABS: 1.0000 | ORAL_TABLET | Freq: Four times a day (QID) | ORAL | 0 refills | Status: DC | PRN

## 2020-04-16 NOTE — Discharge Instructions (Addendum)

## 2020-04-16 NOTE — ED Triage Notes (Signed)
Pt present boil on her right butt cheek, pt states that she cannot sit down.

## 2020-04-16 NOTE — ED Provider Notes (Signed)
[facility]   [id number] 04/16/20 Arrival Time: 1942  ASSESSMENT & PLAN:  1. Abscess of buttock, right     Incision and Drainage Procedure Note  Anesthesia: 2% plain lidocaine  Procedure Details  The procedure, risks and complications have been discussed in detail (including, but not limited to pain and bleeding) with the patient.  The skin induration was prepped and draped in the usual fashion. After adequate local anesthesia, I&D with a #11 blade was performed on the right inner buttock with copious, bloody, purulent drainage.  EBL: minimal Drains: none Packing: 1/2 inch gauze Condition: Tolerated procedure well Complications: none.  Meds ordered this encounter  Medications  . HYDROcodone-acetaminophen (NORCO/VICODIN) 5-325 MG tablet    Sig: Take 1 tablet by mouth every 6 (six) hours as needed for moderate pain or severe pain.    Dispense:  8 tablet    Refill:  0  . doxycycline (VIBRAMYCIN) 100 MG capsule    Sig: Take 1 capsule (100 mg total) by mouth 2 (two) times daily.    Dispense:  14 capsule    Refill:  0    Wound care instructions discussed and given in written format. To return in 48 hours for wound check.  Finish all antibiotics.  Reviewed expectations re: course of current medical issues. Questions answered. Outlined signs and symptoms indicating need for more acute intervention. Patient verbalized understanding. After Visit Summary given.   SUBJECTIVE:  Laurie Holland is a 34 y.o. female who presents with a possible infection of her R buttock. Onset gradual, approximately 4 days ago without active drainage and without active bleeding. Symptoms have gradually worsened since beginning. Increased pain. Fever: absent. OTC/home treatment: none.   OBJECTIVE:  Vitals:   04/16/20 2020  BP: (!) 151/102  Pulse: (!) 118  Resp: 18  Temp: 98.7 F (37.1 C)  TempSrc: Temporal  SpO2: 100%    Tachycardia noted. Pt in pain. General appearance:  alert; no distress R inner buttock: approx 2 x 2 cm induration; tender to touch; no active drainage or bleeding Psychological: alert and cooperative; normal mood and affect  Allergies  Allergen Reactions  . Penicillins     Has patient had a PCN reaction causing immediate rash, facial/tongue/throat swelling, SOB or lightheadedness with hypotension: No Has patient had a PCN reaction causing severe rash involving mucus membranes or skin necrosis: No Has patient had a PCN reaction that required hospitalization: YES Has patient had a PCN reaction occurring within the last 10 years: No If all of the above answers are "NO", then may proceed with Cephalosporin use.    History reviewed. No pertinent past medical history. Social History   Socioeconomic History  . Marital status: Single    Spouse name: Not on file  . Number of children: Not on file  . Years of education: Not on file  . Highest education level: Not on file  Occupational History  . Not on file  Tobacco Use  . Smoking status: Current Every Day Smoker  . Smokeless tobacco: Current User  Vaping Use  . Vaping Use: Never used  Substance and Sexual Activity  . Alcohol use: Yes  . Drug use: No  . Sexual activity: Yes  Other Topics Concern  . Not on file  Social History Narrative  . Not on file   Social Determinants of Health   Financial Resource Strain:   . Difficulty of Paying Living Expenses: Not on file  Food Insecurity:   . Worried About Radiation protection practitioner  of Food in the Last Year: Not on file  . Ran Out of Food in the Last Year: Not on file  Transportation Needs:   . Lack of Transportation (Medical): Not on file  . Lack of Transportation (Non-Medical): Not on file  Physical Activity:   . Days of Exercise per Week: Not on file  . Minutes of Exercise per Session: Not on file  Stress:   . Feeling of Stress : Not on file  Social Connections:   . Frequency of Communication with Friends and Family: Not on file  .  Frequency of Social Gatherings with Friends and Family: Not on file  . Attends Religious Services: Not on file  . Active Member of Clubs or Organizations: Not on file  . Attends Banker Meetings: Not on file  . Marital Status: Not on file   Family History  Family history unknown: Yes   History reviewed. No pertinent surgical history.         Mardella Layman, MD 04/16/20 2049

## 2020-07-10 ENCOUNTER — Encounter (HOSPITAL_COMMUNITY): Payer: Self-pay

## 2020-07-10 ENCOUNTER — Other Ambulatory Visit: Payer: Self-pay

## 2020-07-10 ENCOUNTER — Emergency Department (HOSPITAL_COMMUNITY)
Admission: EM | Admit: 2020-07-10 | Discharge: 2020-07-10 | Disposition: A | Discharge status: Home / Self Care | Payer: Self-pay | Attending: Emergency Medicine | Admitting: Emergency Medicine

## 2020-07-10 DIAGNOSIS — F17200 Nicotine dependence, unspecified, uncomplicated: Secondary | ICD-10-CM | POA: Insufficient documentation

## 2020-07-10 DIAGNOSIS — Z23 Encounter for immunization: Secondary | ICD-10-CM | POA: Insufficient documentation

## 2020-07-10 DIAGNOSIS — L0231 Cutaneous abscess of buttock: Secondary | ICD-10-CM | POA: Insufficient documentation

## 2020-07-10 MED ORDER — LIDOCAINE-EPINEPHRINE 1 %-1:100000 IJ SOLN
10.0000 mL | Freq: Once | INTRAMUSCULAR | Status: AC
  Administered 2020-07-10: 10 mL
  Filled 2020-07-10: qty 1

## 2020-07-10 MED ORDER — CLINDAMYCIN HCL 150 MG PO CAPS
600.0000 mg | ORAL_CAPSULE | Freq: Once | ORAL | Status: AC
  Administered 2020-07-10: 600 mg via ORAL
  Filled 2020-07-10: qty 4

## 2020-07-10 MED ORDER — CLINDAMYCIN HCL 150 MG PO CAPS: 300.0000 mg | ORAL_CAPSULE | Freq: Three times a day (TID) | ORAL | 0 refills | Status: AC

## 2020-07-10 MED ORDER — TETANUS-DIPHTH-ACELL PERTUSSIS 5-2.5-18.5 LF-MCG/0.5 IM SUSY
0.5000 mL | PREFILLED_SYRINGE | Freq: Once | INTRAMUSCULAR | Status: AC
  Administered 2020-07-10: 0.5 mL via INTRAMUSCULAR
  Filled 2020-07-10: qty 0.5

## 2020-07-10 NOTE — Discharge Instructions (Signed)
Like we discussed, please continue to apply warm compresses to the region.  Please take your clindamycin as prescribed.  Do not stop taking it early.  If you develop worsening symptoms or symptoms associated with infection such as nausea, vomiting, fevers, or chills, please return to the emergency department for reevaluation.  It was a pleasure to meet you.

## 2020-07-10 NOTE — ED Triage Notes (Signed)
Patient coming from home, reports she has had an abscess on her R buttocks x 1 week, denies fevers or any drainage.

## 2020-07-10 NOTE — ED Notes (Signed)
E-signature pad unavailable at time of pt discharge. This RN discussed discharge materials with pt and answered all pt questions. Pt stated understanding of discharge material. ? ?

## 2020-07-10 NOTE — ED Provider Notes (Signed)
[facility] EMERGENCY DEPARTMENT Provider Note   CSN: [id number] Arrival date & time: 07/10/20  0050     History Chief Complaint  Patient presents with   Abscess    Laurie Holland is a 34 y.o. female.  HPI Patient is a 34 year old female who presents emergency department due to an abscess.  Patient started experiencing pain and swelling to the right buttock about 1 week ago.  Has been applying warm compresses to the site with minimal relief.  Notes a history of abscesses to the same region in the past.  She states that she will sometimes shave in the region.  Unsure of the timing of her last Tdap.  She denies fevers, chills, nausea, vomiting, urinary changes, diarrhea.    History reviewed. No pertinent past medical history.  There are no problems to display for this patient.   History reviewed. No pertinent surgical history.   OB History   No obstetric history on file.     Family History  Family history unknown: Yes    Social History   Tobacco Use   Smoking status: Current Every Day Smoker   Smokeless tobacco: Current User  Vaping Use   Vaping Use: Never used  Substance Use Topics   Alcohol use: Yes   Drug use: No    Home Medications Prior to Admission medications   Medication Sig Start Date End Date Taking? Authorizing Provider  albuterol (PROVENTIL HFA;VENTOLIN HFA) 108 (90 Base) MCG/ACT inhaler Inhale 1-2 puffs into the lungs every 6 (six) hours as needed for wheezing or shortness of breath. 06/09/17   Nira Conn, MD  benzonatate (TESSALON) 100 MG capsule Take 1 capsule (100 mg total) by mouth 3 (three) times daily as needed for cough. 06/16/17   Petrucelli, Samantha R, PA-C  diphenhydrAMINE (BENADRYL) 25 MG tablet Take 50 mg by mouth every 6 (six) hours as needed.    [provider]  doxycycline (VIBRAMYCIN) 100 MG capsule Take 1 capsule (100 mg total) by mouth 2 (two) times daily. 04/16/20   Mardella Layman, MD   fluticasone (FLONASE) 50 MCG/ACT nasal spray Place 1 spray into both nostrils daily. 06/16/17   Petrucelli, Pleas Koch, PA-C  HYDROcodone-acetaminophen (NORCO/VICODIN) 5-325 MG tablet Take 1 tablet by mouth every 6 (six) hours as needed for moderate pain or severe pain. 04/16/20   Mardella Layman, MD  ibuprofen (ADVIL,MOTRIN) 600 MG tablet Take 1 tablet (600 mg total) by mouth every 8 (eight) hours as needed. 03/12/18   Azalia Bilis, MD  naproxen (NAPROSYN) 500 MG tablet Take 1 tablet (500 mg total) by mouth 2 (two) times daily. 09/16/18   Georgetta Haber, NP  pediatric multivitamin-fluoride (POLY-VI-FLOR) 0.25 MG chewable tablet Chew 1 tablet by mouth daily.    [provider]  predniSONE (DELTASONE) 20 MG tablet 3 tabs po day one, then 2 po daily x 4 days 05/01/17   Rolan Bucco, MD  ranitidine (ZANTAC) 150 MG tablet Take 1 tablet (150 mg total) by mouth 2 (two) times daily. 06/16/17   Petrucelli, Samantha R, PA-C  tetrahydrozoline 0.05 % ophthalmic solution Place 2 drops into both eyes daily as needed (ichy eyes).    [provider]    Allergies    Penicillins  Review of Systems   Review of Systems  Constitutional: Negative for chills and fever.  Gastrointestinal: Negative for diarrhea, nausea and vomiting.  Genitourinary: Negative for dysuria.  Skin: Positive for color change and rash.    Physical Exam Updated  Vital Signs BP (!) 168/108 (BP Location: Right Arm)    Pulse (!) 104    Temp 98.5 F (36.9 C) (Oral)    Resp 19    Ht 5\' 5"  (1.651 m)    Wt 99.8 kg    LMP 06/25/2020    SpO2 100%    BMI 36.61 kg/m   Physical Exam Vitals and nursing note reviewed.  Constitutional:      General: She is not in acute distress.    Appearance: Normal appearance. She is well-developed.  HENT:     Head: Normocephalic and atraumatic.     Right Ear: External ear normal.     Left Ear: External ear normal.  Eyes:     General: No scleral icterus.       Right eye: No discharge.         Left eye: No discharge.     Conjunctiva/sclera: Conjunctivae normal.  Neck:     Trachea: No tracheal deviation.  Cardiovascular:     Rate and Rhythm: Normal rate.  Pulmonary:     Effort: Pulmonary effort is normal. No respiratory distress.     Breath sounds: No stridor.  Abdominal:     General: There is no distension.  Genitourinary:    Comments: 3 cm region of fluctuance with surrounding tenderness and erythema noted to the right buttock, consistent with abscess.  No discharge noted from the site. Musculoskeletal:        General: No swelling or deformity.     Cervical back: Neck supple.  Skin:    General: Skin is warm and dry.     Findings: No rash.  Neurological:     Mental Status: She is alert.     Cranial Nerves: Cranial nerve deficit: no gross deficits.    ED Results / Procedures / Treatments   Labs (all labs ordered are listed, but only abnormal results are displayed) Labs Reviewed - No data to display  EKG None  Radiology No results found.  Procedures .14/10/2021Incision and Drainage  Date/Time: 07/10/2020 3:10 AM Performed by: 07/12/2020, PA-C Authorized by: Placido Sou, PA-C   Consent:    Consent obtained:  Verbal   Consent given by:  Patient   Risks discussed:  Bleeding, pain, incomplete drainage and infection Universal protocol:    Patient identity confirmed:  Verbally with patient and arm band Location:    Type:  Abscess   Size:  3   Location: buttock. Pre-procedure details:    Skin preparation:  Povidone-iodine Sedation:    Sedation type:  None Anesthesia:    Anesthesia method:  Local infiltration   Local anesthetic:  Lidocaine 2% WITH epi Procedure type:    Complexity:  Complex Procedure details:    Ultrasound guidance: no     Needle aspiration: no     Incision types:  Stab incision   Wound management:  Probed and deloculated   Drainage:  Purulent   Drainage amount:  Copious   Wound treatment:  Wound left open   Packing materials:   None Post-procedure details:    Procedure completion:  Tolerated well, no immediate complications    Medications Ordered in ED Medications  lidocaine-EPINEPHrine (XYLOCAINE W/EPI) 1 %-1:100000 (with pres) injection 10 mL (has no administration in time range)  Tdap (BOOSTRIX) injection 0.5 mL (0.5 mLs Intramuscular Given 07/10/20 0246)  clindamycin (CLEOCIN) capsule 600 mg (600 mg Oral Given 07/10/20 0246)    ED Course  I have reviewed the triage vital signs and the  nursing notes.  Pertinent labs & imaging results that were available during my care of the patient were reviewed by me and considered in my medical decision making (see chart for details).    MDM Rules/Calculators/A&P                          Patient is a 34 year old female who presents the emergency department with an abscess to the right buttock.  Tdap updated in the ED.  History of abscesses.  Patient notes shaving in the region.  I performed an I&D of the site.  Resulting in copious amounts of purulent discharge.  Patient tolerated the procedure well.  Please see the procedure note above.  Patient given a dose of clindamycin here in the emergency department.  Will discharge on a course of clindamycin as well.  Recommended returning to the emergency department with new or worsening symptoms.  She verbalized understanding the above plan.  Her questions were answered and she was amicable at the time of discharge.  Final Clinical Impression(s) / ED Diagnoses Final diagnoses:  Abscess of buttock, right    Rx / DC Orders ED Discharge Orders         Ordered    clindamycin (CLEOCIN) 150 MG capsule  3 times daily        07/10/20 0314           Placido Sou, PA-C 07/10/20 0315    Mesner, Barbara Cower, MD 07/10/20 [phone]

## 2020-08-05 ENCOUNTER — Ambulatory Visit (HOSPITAL_COMMUNITY)
Admission: EM | Admit: 2020-08-05 | Discharge: 2020-08-05 | Disposition: A | Discharge status: Home / Self Care | Payer: Self-pay

## 2020-08-05 ENCOUNTER — Other Ambulatory Visit: Payer: Self-pay

## 2020-08-05 ENCOUNTER — Encounter (HOSPITAL_COMMUNITY): Payer: Self-pay

## 2020-08-05 DIAGNOSIS — R030 Elevated blood-pressure reading, without diagnosis of hypertension: Secondary | ICD-10-CM

## 2020-08-05 DIAGNOSIS — K047 Periapical abscess without sinus: Secondary | ICD-10-CM

## 2020-08-05 MED ORDER — LIDOCAINE VISCOUS HCL 2 % MT SOLN: 10.0000 mL | OROMUCOSAL | 0 refills | Status: DC | PRN

## 2020-08-05 MED ORDER — CLINDAMYCIN HCL 300 MG PO CAPS: 300.0000 mg | ORAL_CAPSULE | Freq: Two times a day (BID) | ORAL | 0 refills | Status: DC

## 2020-08-05 MED ORDER — HYDROCODONE-ACETAMINOPHEN 5-325 MG PO TABS: 1.0000 | ORAL_TABLET | Freq: Two times a day (BID) | ORAL | 0 refills | Status: DC | PRN

## 2020-08-05 NOTE — ED Provider Notes (Signed)
[facility]    CSN: [id number] Arrival date & time: 08/05/20  1714      History   Chief Complaint Chief Complaint  Patient presents with  . Dental Pain  . Headache    HPI Laurie Holland is a 35 y.o. female.   Here today with about a week of worsening right upper dental pain from a very large cavity. States she is now having swelling in the area, severe pain with eating or temperature changes. No fever, drainage from area, sweats, body aches, chills. Taking OTC pain relievers with no relief. Has had this issue multiple times in the past. Waiting to get dental insurance.      History reviewed. No pertinent past medical history.  There are no problems to display for this patient.   History reviewed. No pertinent surgical history.  OB History   No obstetric history on file.      Home Medications    Prior to Admission medications   Medication Sig Start Date End Date Taking? Authorizing Provider  aspirin-acetaminophen-caffeine (EXCEDRIN MIGRAINE) [phone] MG tablet Take by mouth every 6 (six) hours as needed for headache.   Yes [provider]  clindamycin (CLEOCIN) 300 MG capsule Take 1 capsule (300 mg total) by mouth 2 (two) times daily. 08/05/20  Yes Particia Nearing, PA-C  lidocaine (XYLOCAINE) 2 % solution Use as directed 10 mLs in the mouth or throat as needed for mouth pain. 08/05/20  Yes Particia Nearing, PA-C  acetaminophen (TYLENOL) 500 MG tablet Take 1,000 mg by mouth every 6 (six) hours as needed for moderate pain or headache.    [provider]  diphenhydrAMINE (BENADRYL) 25 MG tablet Take 50 mg by mouth every 6 (six) hours as needed for itching or allergies.    [provider]  doxycycline (VIBRAMYCIN) 100 MG capsule Take 1 capsule (100 mg total) by mouth 2 (two) times daily. Patient not taking: No sig reported 04/16/20   Mardella Layman, MD  HYDROcodone-acetaminophen (NORCO/VICODIN) 5-325 MG tablet Take 1  tablet by mouth 2 (two) times daily as needed for moderate pain or severe pain. 08/05/20   Particia Nearing, PA-C  ibuprofen (ADVIL) 200 MG tablet Take 400-600 mg by mouth every 6 (six) hours as needed for headache or mild pain.    [provider]    Family History Family History  Family history unknown: Yes    Social History Social History   Tobacco Use  . Smoking status: Current Every Day Smoker  . Smokeless tobacco: Current User  Vaping Use  . Vaping Use: Never used  Substance Use Topics  . Alcohol use: Yes  . Drug use: No     Allergies   Penicillins   Review of Systems Review of Systems PER HPI   Physical Exam Triage Vital Signs ED Triage Vitals  Enc Vitals Group     BP 08/05/20 1806 (!) 169/110     Pulse Rate 08/05/20 1806 86     Resp 08/05/20 1806 16     Temp 08/05/20 1806 98.8 F (37.1 C)     Temp Source 08/05/20 1806 Oral     SpO2 08/05/20 1806 99 %     Weight --      Height --      Head Circumference --      Peak Flow --      Pain Score 08/05/20 1804 9     Pain Loc --      Pain Edu? --  Excl. in GC? --    No data found.  Updated Vital Signs BP (!) 169/110 (BP Location: Left Arm)   Pulse 86   Temp 98.8 F (37.1 C) (Oral)   Resp 16   LMP  (Within Weeks) Comment: 2 weeks  SpO2 99%   Visual Acuity Right Eye Distance:   Left Eye Distance:   Bilateral Distance:    Right Eye Near:   Left Eye Near:    Bilateral Near:     Physical Exam Vitals and nursing note reviewed.  Constitutional:      Appearance: Normal appearance. She is not ill-appearing.  HENT:     Head: Atraumatic.     Mouth/Throat:     Mouth: Mucous membranes are moist.     Comments: Large area of decay right front tooth, gingival erythema at base. No abscess or drainage noted Eyes:     Extraocular Movements: Extraocular movements intact.     Conjunctiva/sclera: Conjunctivae normal.  Cardiovascular:     Rate and Rhythm: Normal rate and regular rhythm.      Heart sounds: Normal heart sounds.  Pulmonary:     Effort: Pulmonary effort is normal.     Breath sounds: Normal breath sounds.  Musculoskeletal:        General: Normal range of motion.     Cervical back: Normal range of motion and neck supple.  Lymphadenopathy:     Cervical: No cervical adenopathy.  Skin:    General: Skin is warm and dry.  Neurological:     Mental Status: She is alert and oriented to person, place, and time.  Psychiatric:        Mood and Affect: Mood normal.        Thought Content: Thought content normal.        Judgment: Judgment normal.      UC Treatments / Results  Labs (all labs ordered are listed, but only abnormal results are displayed) Labs Reviewed - No data to display  EKG   Radiology No results found.  Procedures Procedures (including critical care time)  Medications Ordered in UC Medications - No data to display  Initial Impression / Assessment and Plan / UC Course  I have reviewed the triage vital signs and the nursing notes.  Pertinent labs & imaging results that were available during my care of the patient were reviewed by me and considered in my medical decision making (see chart for details).     Treat with clindamycin, viscous lidocaine and small supply of norco sent for severe prn pain. PDMP reviewed and appropriate. F/u with dentist as soon as able. Elevated BP also noted today, discussed to f/u with a PCP on this and check at home as able. Likely elevated due to pain today but needs recheck.   Final Clinical Impressions(s) / UC Diagnoses   Final diagnoses:  Dental infection  Elevated blood pressure reading   Discharge Instructions   None    ED Prescriptions    Medication Sig Dispense Auth. Provider   HYDROcodone-acetaminophen (NORCO/VICODIN) 5-325 MG tablet Take 1 tablet by mouth 2 (two) times daily as needed for moderate pain or severe pain. 10 tablet Particia Nearing, [place]   clindamycin (CLEOCIN) 300 MG  capsule Take 1 capsule (300 mg total) by mouth 2 (two) times daily. 14 capsule Particia Nearing, PA-C   lidocaine (XYLOCAINE) 2 % solution Use as directed 10 mLs in the mouth or throat as needed for mouth pain. 100 mL Particia Nearing,  PA-C     I have reviewed the PDMP during this encounter.   Particia Nearing, [place] 08/05/20 1828

## 2020-08-05 NOTE — ED Triage Notes (Addendum)
Pt presents with dental pain x 1 week. Ibuprofen, Excedrin and Tylenol gives no relief.   Reports headache x 2 weeks. Denies dizziness, vision changes, chest pain.

## 2020-08-28 IMAGING — CR DG CHEST 2V
2 series · 2 of 2 positions shown · non-contrast
Comparison: April 22, 2017

CLINICAL DATA: Palpitations and dizziness.

EXAM:
CHEST - 2 VIEW

[chest pa]
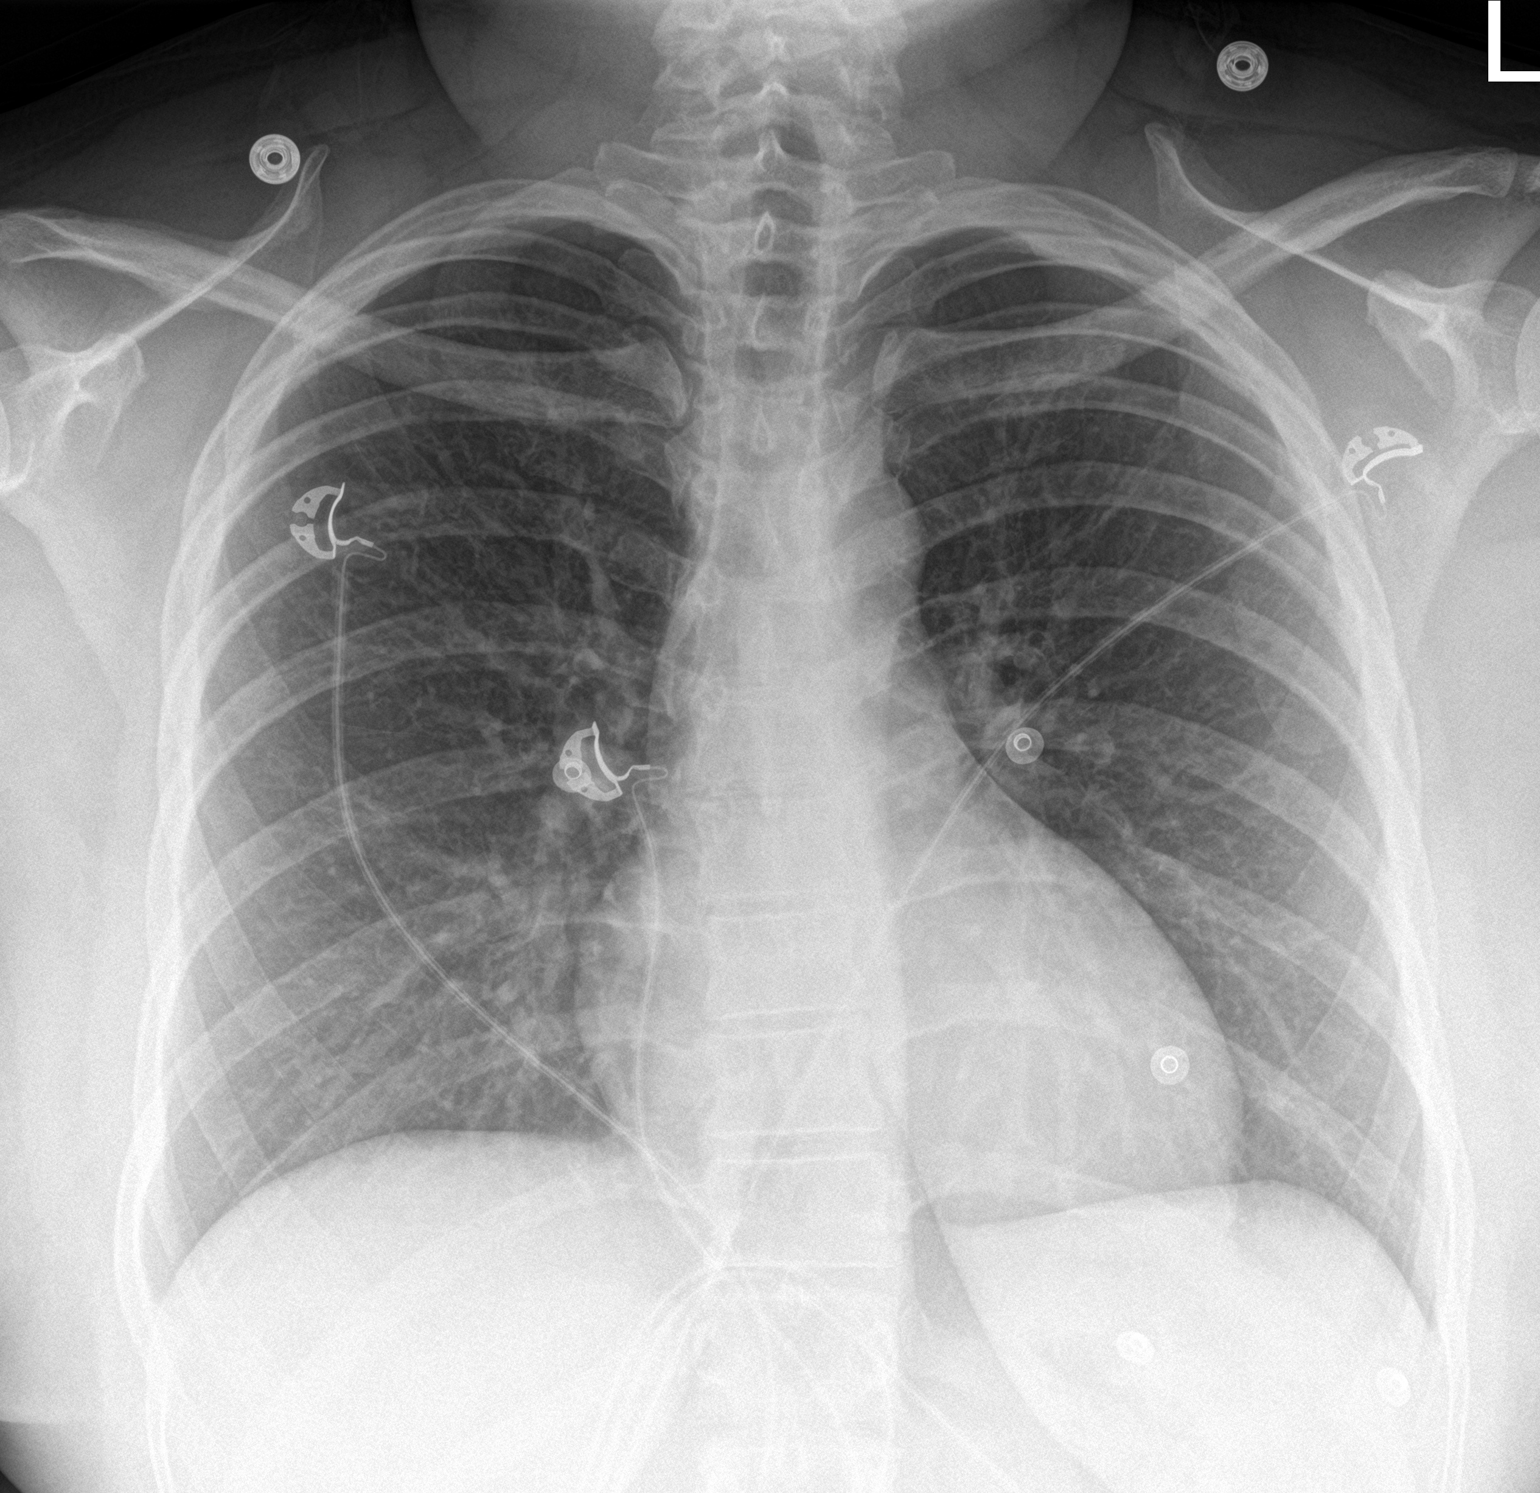

[chest lat]
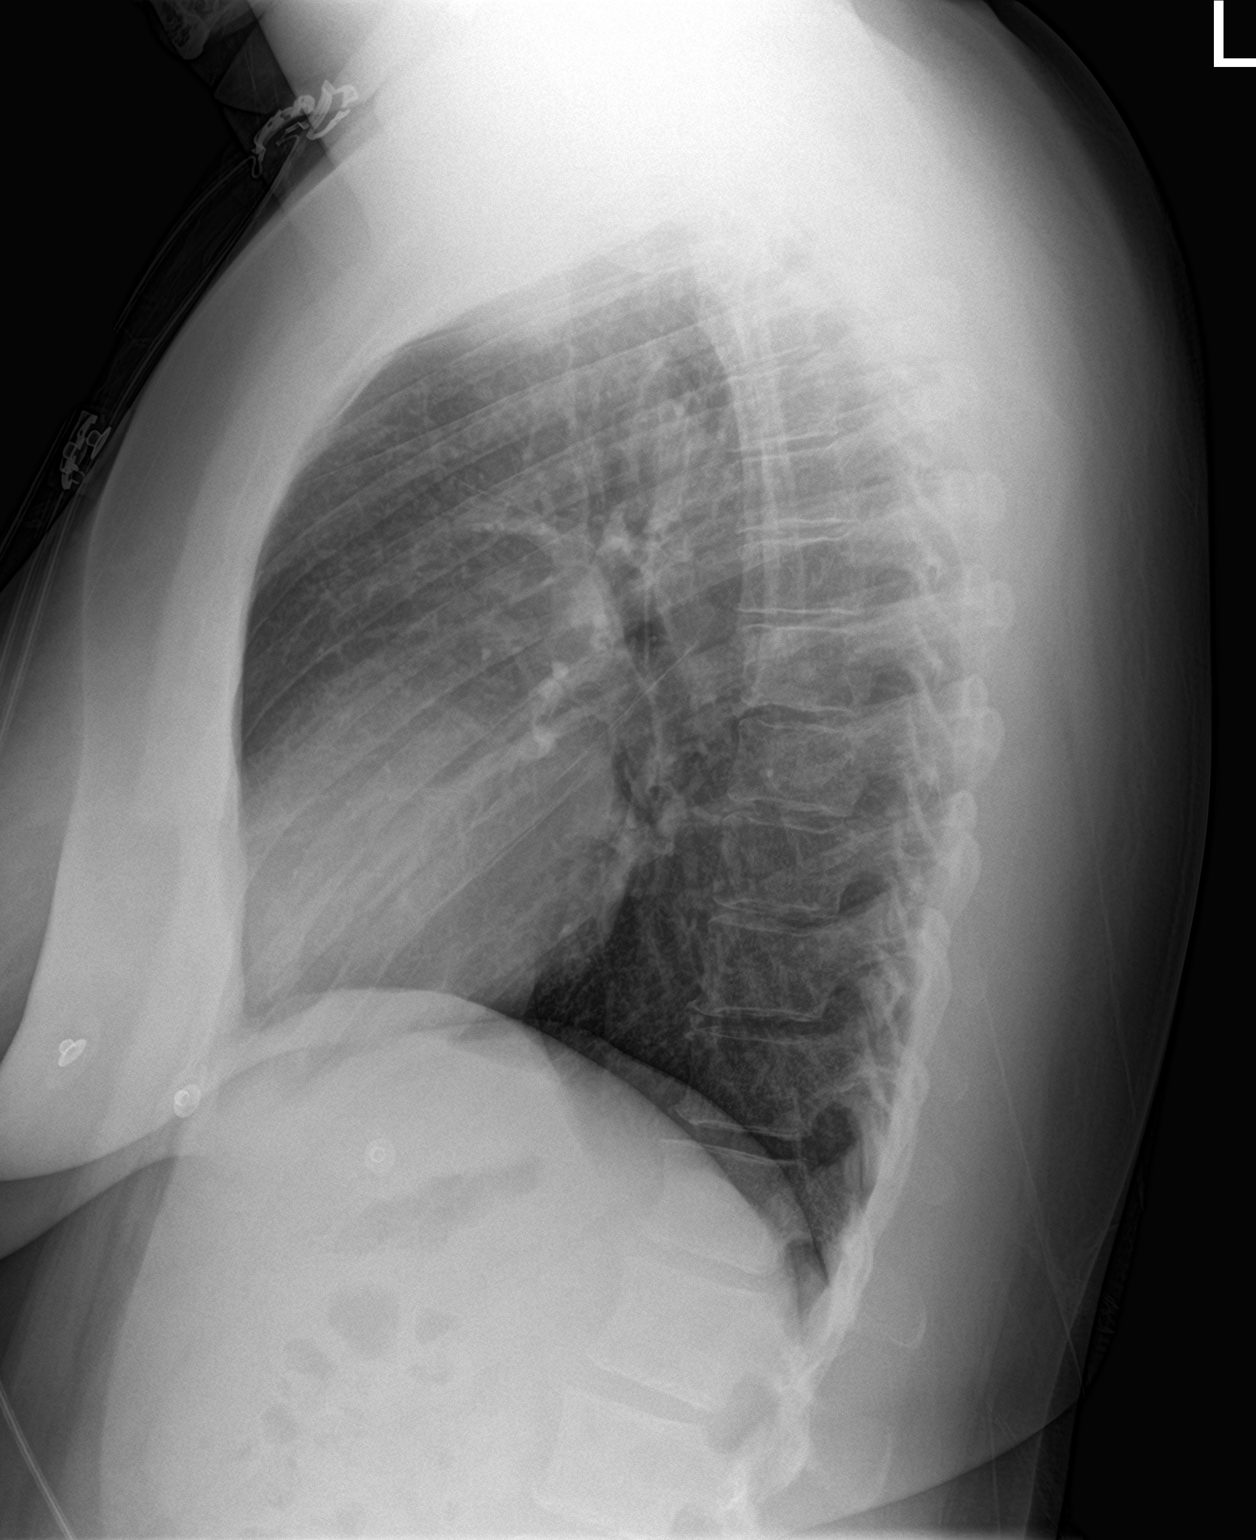

[2 of 2 positions shown; findings below may reference images not displayed]

FINDINGS: The heart size and mediastinal contours are within normal limits.
Both lungs are clear. The visualized skeletal structures are
unremarkable.
IMPRESSION: No active cardiopulmonary disease.

## 2021-02-21 ENCOUNTER — Ambulatory Visit (HOSPITAL_COMMUNITY)
Admission: EM | Admit: 2021-02-21 | Discharge: 2021-02-21 | Disposition: A | Discharge status: Home / Self Care | Payer: Self-pay | Attending: Family Medicine | Admitting: Family Medicine

## 2021-02-21 ENCOUNTER — Other Ambulatory Visit: Payer: Self-pay

## 2021-02-21 DIAGNOSIS — R059 Cough, unspecified: Secondary | ICD-10-CM

## 2021-02-21 DIAGNOSIS — U071 COVID-19: Secondary | ICD-10-CM

## 2021-02-21 MED ORDER — PROMETHAZINE-DM 6.25-15 MG/5ML PO SYRP: 5.0000 mL | ORAL_SOLUTION | Freq: Four times a day (QID) | ORAL | 0 refills | Status: DC | PRN

## 2021-02-21 NOTE — ED Provider Notes (Signed)
[facility]    CSN: [id number] Arrival date & time: 02/21/21  1049      History   Chief Complaint Chief Complaint  Patient presents with   Headache   Fever    HPI Laurie Holland is a 35 y.o. female.   Presenting today with about a week of cough, congestion, sore throat, fever, body aches, chills, occasional shortness of breath with coughing spells.  States the remainder of the symptoms has improved aside from the cough, which is keeping her up at night hacking.  She denies fever the last few days, significant shortness of breath, chest pain, abdominal pain, nausea vomiting or diarrhea.  She tested positive for COVID about 4 days ago but symptoms started a week ago.  Has been taking NyQuil, Mucinex and another over-the-counter cold medications with mild relief.  States she needs a note for work as she has missed the last week.  No known chronic medical problems.   No past medical history on file.  There are no problems to display for this patient.   No past surgical history on file.  OB History   No obstetric history on file.      Home Medications    Prior to Admission medications   Medication Sig Start Date End Date Taking? Authorizing Provider  promethazine-dextromethorphan (PROMETHAZINE-DM) 6.25-15 MG/5ML syrup Take 5 mLs by mouth 4 (four) times daily as needed for cough. 02/21/21  Yes Particia Nearing, PA-C  acetaminophen (TYLENOL) 500 MG tablet Take 1,000 mg by mouth every 6 (six) hours as needed for moderate pain or headache.    [provider]  aspirin-acetaminophen-caffeine (EXCEDRIN MIGRAINE) [phone] MG tablet Take by mouth every 6 (six) hours as needed for headache.    [provider]  clindamycin (CLEOCIN) 300 MG capsule Take 1 capsule (300 mg total) by mouth 2 (two) times daily. 08/05/20   Particia Nearing, PA-C  diphenhydrAMINE (BENADRYL) 25 MG tablet Take 50 mg by mouth every 6 (six) hours as needed for itching or  allergies.    [provider]  doxycycline (VIBRAMYCIN) 100 MG capsule Take 1 capsule (100 mg total) by mouth 2 (two) times daily. Patient not taking: No sig reported 04/16/20   Mardella Layman, MD  HYDROcodone-acetaminophen (NORCO/VICODIN) 5-325 MG tablet Take 1 tablet by mouth 2 (two) times daily as needed for moderate pain or severe pain. 08/05/20   Particia Nearing, PA-C  ibuprofen (ADVIL) 200 MG tablet Take 400-600 mg by mouth every 6 (six) hours as needed for headache or mild pain.    [provider]  lidocaine (XYLOCAINE) 2 % solution Use as directed 10 mLs in the mouth or throat as needed for mouth pain. 08/05/20   Particia Nearing, PA-C    Family History Family History  Family history unknown: Yes    Social History Social History   Tobacco Use   Smoking status: Every Day   Smokeless tobacco: Current  Vaping Use   Vaping Use: Never used  Substance Use Topics   Alcohol use: Yes   Drug use: No     Allergies   Penicillins   Review of Systems Review of Systems Per HPI  Physical Exam Triage Vital Signs ED Triage Vitals  Enc Vitals Group     BP 02/21/21 1356 (!) 151/102     Pulse Rate 02/21/21 1356 86     Resp 02/21/21 1356 18     Temp 02/21/21 1356 98.4 F (36.9 C)  Temp Source 02/21/21 1356 Oral     SpO2 02/21/21 1356 100 %     Weight --      Height --      Head Circumference --      Peak Flow --      Pain Score 02/21/21 1354 0     Pain Loc --      Pain Edu? --      Excl. in GC? --    No data found.  Updated Vital Signs BP (!) 151/102 (BP Location: Right Arm)   Pulse 86   Temp 98.4 F (36.9 C) (Oral)   Resp 18   LMP 02/15/2021 (Exact Date)   SpO2 100%   Visual Acuity Right Eye Distance:   Left Eye Distance:   Bilateral Distance:    Right Eye Near:   Left Eye Near:    Bilateral Near:     Physical Exam Vitals and nursing note reviewed.  Constitutional:      Appearance: Normal appearance. She is not  ill-appearing.  HENT:     Head: Atraumatic.     Right Ear: Tympanic membrane normal.     Left Ear: Tympanic membrane normal.     Nose: Nose normal.     Mouth/Throat:     Mouth: Mucous membranes are moist.     Pharynx: Oropharynx is clear. Posterior oropharyngeal erythema present.  Eyes:     Extraocular Movements: Extraocular movements intact.     Conjunctiva/sclera: Conjunctivae normal.  Cardiovascular:     Rate and Rhythm: Normal rate and regular rhythm.     Heart sounds: Normal heart sounds.  Pulmonary:     Effort: Pulmonary effort is normal. No respiratory distress.     Breath sounds: Normal breath sounds. No wheezing or rales.  Musculoskeletal:        General: Normal range of motion.     Cervical back: Normal range of motion and neck supple.  Skin:    General: Skin is warm and dry.     Findings: No erythema or rash.  Neurological:     Mental Status: She is alert and oriented to person, place, and time.     Motor: No weakness.  Psychiatric:        Mood and Affect: Mood normal.        Thought Content: Thought content normal.        Judgment: Judgment normal.     UC Treatments / Results  Labs (all labs ordered are listed, but only abnormal results are displayed) Labs Reviewed - No data to display  EKG   Radiology No results found.  Procedures Procedures (including critical care time)  Medications Ordered in UC Medications - No data to display  Initial Impression / Assessment and Plan / UC Course  I have reviewed the triage vital signs and the nursing notes.  Pertinent labs & imaging results that were available during my care of the patient were reviewed by me and considered in my medical decision making (see chart for details).     Overall improving, oxygen saturation 100% and afebrile today, very well-appearing and in no distress.  Discussed that per CDC protocol she is eligible to return to work with a mask.  She states she does not feel 100% ready to  return, wishes to return later this week.  Work note given to return Thursday, Phenergan DM for nighttime cough.  Continue over-the-counter supportive medications and home care.  Return for acutely worsening symptoms.  Final Clinical  Impressions(s) / UC Diagnoses   Final diagnoses:  COVID-19  Cough   Discharge Instructions   None    ED Prescriptions     Medication Sig Dispense Auth. Provider   promethazine-dextromethorphan (PROMETHAZINE-DM) 6.25-15 MG/5ML syrup Take 5 mLs by mouth 4 (four) times daily as needed for cough. 100 mL Particia Nearing, [place]      PDMP not reviewed this encounter.   Particia Nearing, [place] 02/21/21 1428

## 2021-02-21 NOTE — ED Triage Notes (Signed)
Pt presents with cough, chest pain when coughing, States she took an at home COVID test that resulted positive. Request a note for work.

## 2022-03-01 ENCOUNTER — Encounter (HOSPITAL_COMMUNITY): Payer: Self-pay

## 2022-03-01 ENCOUNTER — Ambulatory Visit (HOSPITAL_COMMUNITY)
Admission: EM | Admit: 2022-03-01 | Discharge: 2022-03-01 | Disposition: A | Discharge status: Home / Self Care | Payer: Self-pay | Attending: Emergency Medicine | Admitting: Emergency Medicine

## 2022-03-01 DIAGNOSIS — R21 Rash and other nonspecific skin eruption: Secondary | ICD-10-CM

## 2022-03-01 DIAGNOSIS — Z113 Encounter for screening for infections with a predominantly sexual mode of transmission: Secondary | ICD-10-CM

## 2022-03-01 DIAGNOSIS — R1030 Lower abdominal pain, unspecified: Secondary | ICD-10-CM

## 2022-03-01 LAB — POCT URINALYSIS DIPSTICK, ED / UC
Bilirubin Urine: NEGATIVE
Glucose, UA: NEGATIVE mg/dL
Hgb urine dipstick: NEGATIVE
Leukocytes,Ua: NEGATIVE
Nitrite: NEGATIVE
Protein, ur: 30 mg/dL — AB
Specific Gravity, Urine: 1.025 (ref 1.005–1.030)
Urobilinogen, UA: 0.2 mg/dL (ref 0.0–1.0)
pH: 6.5 (ref 5.0–8.0)

## 2022-03-01 LAB — HIV ANTIBODY (ROUTINE TESTING W REFLEX): HIV Screen 4th Generation wRfx: NONREACTIVE

## 2022-03-01 LAB — POC URINE PREG, ED: Preg Test, Ur: NEGATIVE

## 2022-03-01 MED ORDER — CLOTRIMAZOLE 1 % EX CREA: TOPICAL_CREAM | CUTANEOUS | 0 refills | Status: AC

## 2022-03-01 MED ORDER — FLUCONAZOLE 150 MG PO TABS: 150.0000 mg | ORAL_TABLET | Freq: Once | ORAL | 0 refills | Status: DC | PRN

## 2022-03-01 NOTE — ED Provider Notes (Signed)
[facility]    CSN: [id number] Arrival date & time: 03/01/22  9323     History   Chief Complaint Chief Complaint  Patient presents with   Abdominal Pain   Rash   SEXUALLY TRANSMITTED DISEASE    testing    HPI Laurie Holland is a 36 y.o. female.  Presents with rash she noticed 2 weeks ago that has somewhat resolved. Noticed a raw, irritated, itchy area in the right groin.  Reports she works out a lot and wears tight clothing and sweats a lot.  Some suprapubic abdominal discomfort as well. Reports some vaginal itching, history of yeast infection and it feels like this. Denies any nausea, vomiting, diarrhea/constipation, fever, vaginal discharge, bleeding or spotting. LMP 8/1 Requesting STD testing including blood work.  History reviewed. No pertinent past medical history.  There are no problems to display for this patient.   History reviewed. No pertinent surgical history.  OB History   No obstetric history on file.      Home Medications    Prior to Admission medications   Medication Sig Start Date End Date Taking? Authorizing Provider  clotrimazole (LOTRIMIN) 1 % cream Apply to affected area 2 times daily 03/01/22  Yes Hubert Raatz, Lurena Joiner, PA-C  fluconazole (DIFLUCAN) 150 MG tablet Take 1 tablet (150 mg total) by mouth once as needed for up to 2 doses (take one pill on day 1, and the second pill 3 days later if symptoms do not improve). 03/01/22  Yes Brinley Treanor, Lurena Joiner, PA-C  acetaminophen (TYLENOL) 500 MG tablet Take 1,000 mg by mouth every 6 (six) hours as needed for moderate pain or headache.    [provider]  aspirin-acetaminophen-caffeine (EXCEDRIN MIGRAINE) [phone] MG tablet Take by mouth every 6 (six) hours as needed for headache.    [provider]  clindamycin (CLEOCIN) 300 MG capsule Take 1 capsule (300 mg total) by mouth 2 (two) times daily. 08/05/20   Particia Nearing, PA-C  diphenhydrAMINE (BENADRYL) 25 MG tablet Take 50 mg  by mouth every 6 (six) hours as needed for itching or allergies.    [provider]  doxycycline (VIBRAMYCIN) 100 MG capsule Take 1 capsule (100 mg total) by mouth 2 (two) times daily. Patient not taking: No sig reported 04/16/20   Mardella Layman, MD  HYDROcodone-acetaminophen (NORCO/VICODIN) 5-325 MG tablet Take 1 tablet by mouth 2 (two) times daily as needed for moderate pain or severe pain. 08/05/20   Particia Nearing, PA-C  ibuprofen (ADVIL) 200 MG tablet Take 400-600 mg by mouth every 6 (six) hours as needed for headache or mild pain.    [provider]  lidocaine (XYLOCAINE) 2 % solution Use as directed 10 mLs in the mouth or throat as needed for mouth pain. 08/05/20   Particia Nearing, PA-C  promethazine-dextromethorphan (PROMETHAZINE-DM) 6.25-15 MG/5ML syrup Take 5 mLs by mouth 4 (four) times daily as needed for cough. 02/21/21   Particia Nearing, PA-C    Family History Family History  Family history unknown: Yes    Social History Social History   Tobacco Use   Smoking status: Every Day   Smokeless tobacco: Current  Vaping Use   Vaping Use: Never used  Substance Use Topics   Alcohol use: Yes   Drug use: No     Allergies   Penicillins   Review of Systems Review of Systems  Gastrointestinal:  Positive for abdominal pain.  Skin:  Positive for rash.   Per HPI  Physical Exam  Triage Vital Signs ED Triage Vitals  Enc Vitals Group     BP 03/01/22 0953 (!) 177/106     Pulse Rate 03/01/22 0953 (!) 109     Resp 03/01/22 0953 16     Temp 03/01/22 0953 97.9 F (36.6 C)     Temp Source 03/01/22 0953 Oral     SpO2 03/01/22 0953 100 %     Weight 03/01/22 0955 208 lb (94.3 kg)     Height 03/01/22 0955 5\' 5"  (1.651 m)     Head Circumference --      Peak Flow --      Pain Score 03/01/22 0955 3     Pain Loc --      Pain Edu? --      Excl. in GC? --    No data found.  Updated Vital Signs BP (!) 166/102 (BP Location: Right Arm)   Pulse  (!) 109   Temp 97.9 F (36.6 C) (Oral)   Resp 16   Ht 5\' 5"  (1.651 m)   Wt 208 lb (94.3 kg)   LMP 02/14/2022 (Exact Date)   SpO2 100%   BMI 34.61 kg/m      Physical Exam Vitals and nursing note reviewed. Exam conducted with a chaperone present.  Constitutional:      Appearance: Normal appearance.  HENT:     Mouth/Throat:     Pharynx: Oropharynx is clear.  Eyes:     Conjunctiva/sclera: Conjunctivae normal.  Cardiovascular:     Rate and Rhythm: Normal rate and regular rhythm.     Heart sounds: Normal heart sounds.  Pulmonary:     Effort: Pulmonary effort is normal.     Breath sounds: Normal breath sounds.  Abdominal:     General: Abdomen is flat. Bowel sounds are normal.     Palpations: Abdomen is soft.     Tenderness: There is no abdominal tenderness. There is no right CVA tenderness, left CVA tenderness or guarding.  Skin:    Findings: Rash present.     Comments: Area of irritation in right groin.  No ulceration.  Appears fungal  Neurological:     Mental Status: She is alert and oriented to person, place, and time.    UC Treatments / Results  Labs (all labs ordered are listed, but only abnormal results are displayed) Labs Reviewed  POCT URINALYSIS DIPSTICK, ED / UC - Abnormal; Notable for the following components:      Result Value   Ketones, ur TRACE (*)    Protein, ur 30 (*)    All other components within normal limits  RPR  HIV ANTIBODY (ROUTINE TESTING W REFLEX)  POC URINE PREG, ED  CERVICOVAGINAL ANCILLARY ONLY    EKG  Radiology No results found.  Procedures Procedures   Medications Ordered in UC Medications - No data to display  Initial Impression / Assessment and Plan / UC Course  I have reviewed the triage vital signs and the nursing notes.  Pertinent labs & imaging results that were available during my care of the patient were reviewed by me and considered in my medical decision making (see chart for details).  Urine pregnancy negative.   Urinalysis unremarkable.   Oral fluconazole for possible yeast infection.  Cytology swab pending. RPR and HIV pending as well. Clotrimazole cream to use twice daily in the groin area for possible tinea. Return precautions discussed.  Patient agrees to plan  Final Clinical Impressions(s) / UC Diagnoses   Final diagnoses:  Rash  Screen for STD (sexually transmitted disease)  Lower abdominal pain     Discharge Instructions      Please take medication as prescribed.  You can use the cream twice daily in the area of irritation.  We will call you if any results return positive.     ED Prescriptions     Medication Sig Dispense Auth. Provider   fluconazole (DIFLUCAN) 150 MG tablet Take 1 tablet (150 mg total) by mouth once as needed for up to 2 doses (take one pill on day 1, and the second pill 3 days later if symptoms do not improve). 2 tablet Damyon Mullane, PA-C   clotrimazole (LOTRIMIN) 1 % cream Apply to affected area 2 times daily 15 g Digna Countess, Lurena Joiner, PA-C      PDMP not reviewed this encounter.   Nakiesha Rumsey, Ray Church 03/01/22 1131

## 2022-03-01 NOTE — Discharge Instructions (Addendum)
Please take medication as prescribed.  You can use the cream twice daily in the area of irritation.  We will call you if any results return positive.

## 2022-03-01 NOTE — ED Triage Notes (Signed)
Patient having low abdominal pain and rash on the bikini line. Onset 2 weeks ago. Rash is not spreading and has gone away. Patient states there was no drainage from the rash, it was red and puffy, also was itchy.

## 2022-03-02 LAB — CERVICOVAGINAL ANCILLARY ONLY
Bacterial Vaginitis (gardnerella): NEGATIVE
Candida Glabrata: NEGATIVE
Candida Vaginitis: POSITIVE — AB
Chlamydia: NEGATIVE
Comment: NEGATIVE
Comment: NEGATIVE
Comment: NEGATIVE
Comment: NEGATIVE
Comment: NEGATIVE
Comment: NORMAL
Neisseria Gonorrhea: NEGATIVE
Trichomonas: NEGATIVE

## 2022-03-02 LAB — RPR: RPR Ser Ql: NONREACTIVE

## 2022-07-25 ENCOUNTER — Encounter (HOSPITAL_BASED_OUTPATIENT_CLINIC_OR_DEPARTMENT_OTHER): Payer: Self-pay

## 2022-07-25 ENCOUNTER — Emergency Department (HOSPITAL_BASED_OUTPATIENT_CLINIC_OR_DEPARTMENT_OTHER)
Admission: EM | Admit: 2022-07-25 | Discharge: 2022-07-26 | Discharge status: Against Medical Advice | Payer: Self-pay | Attending: Emergency Medicine | Admitting: Emergency Medicine

## 2022-07-25 ENCOUNTER — Other Ambulatory Visit: Payer: Self-pay

## 2022-07-25 DIAGNOSIS — K0889 Other specified disorders of teeth and supporting structures: Secondary | ICD-10-CM | POA: Insufficient documentation

## 2022-07-25 DIAGNOSIS — Z5321 Procedure and treatment not carried out due to patient leaving prior to being seen by health care provider: Secondary | ICD-10-CM | POA: Insufficient documentation

## 2022-07-25 NOTE — ED Triage Notes (Signed)
Patient here POV from Home.  Endorses Toothache Intermittently for approximately 1 Month. Worsening since it began. Believes it is due to a Broken/Infected Tooth.  No Known Fevers.  NAD Noted during Triage. A&Ox4. GCS 15. Ambulatory.

## 2022-07-26 ENCOUNTER — Other Ambulatory Visit: Payer: Self-pay

## 2022-07-26 ENCOUNTER — Encounter (HOSPITAL_BASED_OUTPATIENT_CLINIC_OR_DEPARTMENT_OTHER): Payer: Self-pay

## 2022-07-26 ENCOUNTER — Emergency Department (HOSPITAL_BASED_OUTPATIENT_CLINIC_OR_DEPARTMENT_OTHER)
Admission: EM | Admit: 2022-07-26 | Discharge: 2022-07-26 | Disposition: A | Discharge status: Home / Self Care | Payer: Self-pay | Attending: Emergency Medicine | Admitting: Emergency Medicine

## 2022-07-26 DIAGNOSIS — K047 Periapical abscess without sinus: Secondary | ICD-10-CM | POA: Insufficient documentation

## 2022-07-26 DIAGNOSIS — K029 Dental caries, unspecified: Secondary | ICD-10-CM | POA: Insufficient documentation

## 2022-07-26 MED ORDER — OXYCODONE-ACETAMINOPHEN 5-325 MG PO TABS
1.0000 | ORAL_TABLET | Freq: Once | ORAL | Status: AC
  Administered 2022-07-26: 1 via ORAL
  Filled 2022-07-26: qty 1

## 2022-07-26 MED ORDER — OXYCODONE-ACETAMINOPHEN 5-325 MG PO TABS
1.0000 | ORAL_TABLET | Freq: Four times a day (QID) | ORAL | 0 refills | Status: DC | PRN
Start: 2022-07-26 — End: 2023-03-18

## 2022-07-26 MED ORDER — CLINDAMYCIN HCL 150 MG PO CAPS: 150.0000 mg | ORAL_CAPSULE | Freq: Four times a day (QID) | ORAL | 0 refills | Status: DC

## 2022-07-26 NOTE — Discharge Instructions (Addendum)
You were seen in the emergency department today for dental pain.  As we discussed your tooth is showing signs of DKA, and I think that you are developing an infection tissue.  I am placing you on a course of antibiotics, as well as short course of pain medication.  This is only to be taken for severe pain on top of what you have already been taking at home.  I attached a list of dentists in the area for you to do research and follow-up with.  Continue to monitor how you are doing and return to the emergency department for any new or worsening symptoms such as fever or difficulty swallowing.

## 2022-07-26 NOTE — ED Triage Notes (Signed)
Patient here POV from Home.  Endorses Dental Pain. Seen yesterday in ED but LWBS due to Wait. No new Complaints.   NAD Noted during Triage. A&Ox4. GCS 15. Ambulatory.

## 2022-07-26 NOTE — ED Provider Notes (Signed)
[facility] DE[facility] Provider Note   CSN: [id number] Arrival date & time: 07/26/22  1818     History  Chief Complaint  Patient presents with   Dental Pain    Laurie Holland is a 37 y.o. female with no significant past medical history presents the emergency department complaining of dental pain. Patient states that she has been having problems with the same tooth for for several months, maybe up to a year.  She has not had a chance to see a dentist as she does not currently have insurance.  She reports having many problems with her teeth in the past.  She has been trying over-the-counter ibuprofen, Tylenol, Goody's powder, Excedrin all with minimal relief.  She started taking some clindamycin that her friend had, but ran out of this about 2 weeks ago.  No fevers, difficulty swallowing, sore throat.   Dental Pain Associated symptoms: no fever        Home Medications Prior to Admission medications   Medication Sig Start Date End Date Taking? Authorizing Provider  clindamycin (CLEOCIN) 150 MG capsule Take 1 capsule (150 mg total) by mouth every 6 (six) hours. 07/26/22  Yes Aadya Kindler T, PA-C  oxyCODONE-acetaminophen (PERCOCET/ROXICET) 5-325 MG tablet Take 1 tablet by mouth every 6 (six) hours as needed for severe pain. 07/26/22  Yes Jhace Fennell T, PA-C  acetaminophen (TYLENOL) 500 MG tablet Take 1,000 mg by mouth every 6 (six) hours as needed for moderate pain or headache.    [provider]  aspirin-acetaminophen-caffeine (EXCEDRIN MIGRAINE) [phone] MG tablet Take by mouth every 6 (six) hours as needed for headache.    [provider]  clotrimazole (LOTRIMIN) 1 % cream Apply to affected area 2 times daily 03/01/22   Rising, Wells Guiles, PA-C  diphenhydrAMINE (BENADRYL) 25 MG tablet Take 50 mg by mouth every 6 (six) hours as needed for itching or allergies.    [provider]  fluconazole (DIFLUCAN) 150 MG tablet Take 1 tablet (150  mg total) by mouth once as needed for up to 2 doses (take one pill on day 1, and the second pill 3 days later if symptoms do not improve). 03/01/22   Rising, Wells Guiles, PA-C  ibuprofen (ADVIL) 200 MG tablet Take 400-600 mg by mouth every 6 (six) hours as needed for headache or mild pain.    [provider]  lidocaine (XYLOCAINE) 2 % solution Use as directed 10 mLs in the mouth or throat as needed for mouth pain. 08/05/20   Volney American, PA-C  promethazine-dextromethorphan (PROMETHAZINE-DM) 6.25-15 MG/5ML syrup Take 5 mLs by mouth 4 (four) times daily as needed for cough. 02/21/21   Volney American, PA-C      Allergies    Penicillins    Review of Systems   Review of Systems  Constitutional:  Negative for fever.  HENT:  Positive for dental problem. Negative for trouble swallowing.   All other systems reviewed and are negative.   Physical Exam Updated Vital Signs BP (!) 184/129 (BP Location: Right Arm)   Pulse 89   Temp 98 F (36.7 C) (Oral)   Resp 18   Ht 5\' 5"  (1.651 m)   Wt 94.3 kg   SpO2 100%   BMI 34.60 kg/m  Physical Exam Vitals and nursing note reviewed.  Constitutional:      Appearance: Normal appearance.  HENT:     Head: Normocephalic and atraumatic.     Mouth/Throat:     Lips: Pink.  Mouth: Mucous membranes are moist.     Dentition: Abnormal dentition. Dental tenderness and dental caries present.     Tonsils: No tonsillar abscesses.      Comments: Decay of lower left incisor with erythema of surrounding gingiva, no obvious abscess Eyes:     Conjunctiva/sclera: Conjunctivae normal.  Pulmonary:     Effort: Pulmonary effort is normal. No respiratory distress.  Skin:    General: Skin is warm and dry.  Neurological:     Mental Status: She is alert.  Psychiatric:        Mood and Affect: Mood normal.        Behavior: Behavior normal.     ED Results / Procedures / Treatments   Labs (all labs ordered are listed, but only abnormal results  are displayed) Labs Reviewed - No data to display  EKG None  Radiology No results found.  Procedures Procedures    Medications Ordered in ED Medications  oxyCODONE-acetaminophen (PERCOCET/ROXICET) 5-325 MG per tablet 1 tablet (1 tablet Oral Given 07/26/22 2239)    ED Course/ Medical Decision Making/ A&P                           Medical Decision Making Risk Prescription drug management.  This patient is a 37 y.o. female who presents to the ED for concern of dental pain.   Differential diagnoses prior to evaluation: Dental fracture, dental caries, periapical abscess, gingival disease, peritonsillar abscess, ludwig's angina  Past Medical History / Social History / Additional history: Chart reviewed. Pertinent results include: No significant PMH  Physical Exam: Physical exam performed. The pertinent findings include: Hypertensive, but otherwise normal vitals. Decay of lower left incisor with erythema of surrounding gingiva, no abscess. No sublingual or submandibular erythema.   Medications / Treatment: Given one time dose of pain medication   Disposition: After consideration of the diagnostic results and the patients response to treatment, I feel that emergency department workup does not suggest an emergent condition requiring admission or immediate intervention beyond what has been performed at this time. The plan is: discharge to home with short course of pain medication and antibiotics. Given dental resources. No abscess, PTA, ludwig's angina. The patient is safe for discharge and has been instructed to return immediately for worsening symptoms, change in symptoms or any other concerns.  Final Clinical Impression(s) / ED Diagnoses Final diagnoses:  Pain due to dental caries  Dental infection    Rx / DC Orders ED Discharge Orders          Ordered    oxyCODONE-acetaminophen (PERCOCET/ROXICET) 5-325 MG tablet  Every 6 hours PRN        07/26/22 2211    clindamycin  (CLEOCIN) 150 MG capsule  Every 6 hours        07/26/22 2211           Portions of this report may have been transcribed using voice recognition software. Every effort was made to ensure accuracy; however, inadvertent computerized transcription errors may be present.    Estill Cotta 07/27/22 0004    Dorie Rank, MD 07/28/22 1055

## 2022-07-26 NOTE — ED Notes (Addendum)
Patient stated that she was "tired of waiting and that this is ridiculous". This RN informed the patient that the MD was seeing other patients but would be seeing her soon as the MD had signed up for her. Patient states that she "knows this is an ER but this is ridiculous".  Patient visualized leaving ED without hesitation or making anyone aware.

## 2022-08-16 ENCOUNTER — Telehealth: Payer: Self-pay | Admitting: Physician Assistant

## 2022-08-16 DIAGNOSIS — K047 Periapical abscess without sinus: Secondary | ICD-10-CM

## 2022-08-16 MED ORDER — ETODOLAC 200 MG PO CAPS: 200.0000 mg | ORAL_CAPSULE | Freq: Three times a day (TID) | ORAL | 0 refills | Status: DC

## 2022-08-16 MED ORDER — CLINDAMYCIN HCL 300 MG PO CAPS: 300.0000 mg | ORAL_CAPSULE | Freq: Three times a day (TID) | ORAL | 0 refills | Status: AC

## 2022-08-16 NOTE — Patient Instructions (Addendum)
Rowe Clack, thank you for joining [name], PA-C for today's virtual visit.  While this provider is not your primary care provider (PCP), if your PCP is located in our provider database this encounter information will be shared with them immediately following your visit.   [facility] account gives you access to today's visit and all your visits, tests, and labs performed at [facility] " click here if you don't have a [facility] account or go to [email or web address]  Consent: (Patient) Laurie Holland provided verbal consent for this virtual visit at the beginning of the encounter.  Current Medications:  Current Outpatient Medications:    clindamycin (CLEOCIN) 300 MG capsule, Take 1 capsule (300 mg total) by mouth 3 (three) times daily for 7 days., Disp: 21 capsule, Rfl: 0   etodolac (LODINE) 200 MG capsule, Take 1 capsule (200 mg total) by mouth every 8 (eight) hours., Disp: 15 capsule, Rfl: 0   acetaminophen (TYLENOL) 500 MG tablet, Take 1,000 mg by mouth every 6 (six) hours as needed for moderate pain or headache., Disp: , Rfl:    aspirin-acetaminophen-caffeine (EXCEDRIN MIGRAINE) 250-250-65 MG tablet, Take by mouth every 6 (six) hours as needed for headache., Disp: , Rfl:    clotrimazole (LOTRIMIN) 1 % cream, Apply to affected area 2 times daily, Disp: 15 g, Rfl: 0   diphenhydrAMINE (BENADRYL) 25 MG tablet, Take 50 mg by mouth every 6 (six) hours as needed for itching or allergies., Disp: , Rfl:    fluconazole (DIFLUCAN) 150 MG tablet, Take 1 tablet (150 mg total) by mouth once as needed for up to 2 doses (take one pill on day 1, and the second pill 3 days later if symptoms do not improve)., Disp: 2 tablet, Rfl: 0   ibuprofen (ADVIL) 200 MG tablet, Take 400-600 mg by mouth every 6 (six) hours as needed for headache or mild pain., Disp: , Rfl:    lidocaine (XYLOCAINE) 2 % solution, Use as directed 10 mLs in the mouth or throat as needed  for mouth pain., Disp: 100 mL, Rfl: 0   oxyCODONE-acetaminophen (PERCOCET/ROXICET) 5-325 MG tablet, Take 1 tablet by mouth every 6 (six) hours as needed for severe pain., Disp: 8 tablet, Rfl: 0   promethazine-dextromethorphan (PROMETHAZINE-DM) 6.25-15 MG/5ML syrup, Take 5 mLs by mouth 4 (four) times daily as needed for cough., Disp: 100 mL, Rfl: 0   Medications ordered in this encounter:  Meds ordered this encounter  Medications   clindamycin (CLEOCIN) 300 MG capsule    Sig: Take 1 capsule (300 mg total) by mouth 3 (three) times daily for 7 days.    Dispense:  21 capsule    Refill:  0    Order Specific Question:   Supervising Provider    Answer:   Chase Picket [[id number]]   etodolac (LODINE) 200 MG capsule    Sig: Take 1 capsule (200 mg total) by mouth every 8 (eight) hours.    Dispense:  15 capsule    Refill:  0    Order Specific Question:   Supervising Provider    Answer:   Chase Picket [id number]     *If you need refills on other medications prior to your next appointment, please contact your pharmacy*  Follow-Up: Call back or seek an in-person evaluation if the symptoms worsen or if the condition fails to improve as anticipated.  [facility] [phone]  Other Instructions Please take antibiotic as directed with food. Take a  daily probiotic. Ok to continue OTC Tylenol. Use the Etodolac as directed. Continue clove paste.  Please review dental resources I have listed below. If things are not calming down or anything new/worsening -- you need an in-person evaluation.      If you have been instructed to have an in-person evaluation today at a local Urgent Care facility, please use the link below. It will take you to a list of all of our available [facility], including address, phone number and hours of operation. Please do not delay care.  [facility]  If you or a family member do not have a primary care provider, use the  link below to schedule a visit and establish care. When you choose a [facility] primary care physician or advanced practice provider, you gain a long-term partner in health. Find a Primary Care Provider  Learn more about [facility] in-office and virtual care options: [facility]

## 2022-08-16 NOTE — Progress Notes (Signed)
Virtual Visit Consent   Laurie Holland, you are scheduled for a virtual visit with a [facility] provider today. Just as with appointments in the office, your consent must be obtained to participate. Your consent will be active for this visit and any virtual visit you may have with one of our providers in the next 365 days. If you have a MyChart account, a copy of this consent can be sent to you electronically.  As this is a virtual visit, video technology does not allow for your provider to perform a traditional examination. This may limit your provider's ability to fully assess your condition. If your provider identifies any concerns that need to be evaluated in person or the need to arrange testing (such as labs, EKG, etc.), we will make arrangements to do so. Although advances in technology are sophisticated, we cannot ensure that it will always work on either your end or our end. If the connection with a video visit is poor, the visit may have to be switched to a telephone visit. With either a video or telephone visit, we are not always able to ensure that we have a secure connection.  By engaging in this virtual visit, you consent to the provision of healthcare and authorize for your insurance to be billed (if applicable) for the services provided during this visit. Depending on your insurance coverage, you may receive a charge related to this service.  I need to obtain your verbal consent now. Are you willing to proceed with your visit today? Laurie Holland has provided verbal consent on 08/16/2022 for a virtual visit (video or telephone). Laurie Holland, [place]  Date: 08/16/2022 12:56 PM  Virtual Visit via Video Note   I, Laurie Holland, connected with  Laurie Holland  ([id number], 10/26/1985) on 08/16/22 at 12:30 PM EST by a video-enabled telemedicine application and verified that I am speaking with the correct person using two identifiers.  Location: Patient: Virtual Visit  Location Patient: Home Provider: Virtual Visit Location Provider: Home Office   I discussed the limitations of evaluation and management by telemedicine and the availability of in person appointments. The patient expressed understanding and agreed to proceed.    History of Present Illness: Laurie Holland is a 37 y.o. who identifies as a female who was assigned female at birth, and is being seen today for concerns about recurrent dental infection. Was seen at beginning of month with pain and infection around L bottom bicuspid. Is without dental provider. Was started on Clindamycin 150 mg QID x 7 days. Also given Oxycodone. Noted improvement with the Clindamycin, unsure if fully resolved. Over past few days noting increased pain again with swelling. Denies fever, chills. Is able to fully open and close the jaw.  HPI: HPI  Problems: There are no problems to display for this patient.   Allergies:  Allergies  Allergen Reactions   Penicillins     Has patient had a PCN reaction causing immediate rash, facial/tongue/throat swelling, SOB or lightheadedness with hypotension: No Has patient had a PCN reaction causing severe rash involving mucus membranes or skin necrosis: No Has patient had a PCN reaction that required hospitalization: YES Has patient had a PCN reaction occurring within the last 10 years: No If all of the above answers are "NO", then may proceed with Cephalosporin use.   Medications:  Current Outpatient Medications:    clindamycin (CLEOCIN) 300 MG capsule, Take 1 capsule (300 mg total) by mouth 3 (three) times daily for 7 days., Disp:  21 capsule, Rfl: 0   etodolac (LODINE) 200 MG capsule, Take 1 capsule (200 mg total) by mouth every 8 (eight) hours., Disp: 15 capsule, Rfl: 0   acetaminophen (TYLENOL) 500 MG tablet, Take 1,000 mg by mouth every 6 (six) hours as needed for moderate pain or headache., Disp: , Rfl:    aspirin-acetaminophen-caffeine (EXCEDRIN MIGRAINE) 250-250-65 MG  tablet, Take by mouth every 6 (six) hours as needed for headache., Disp: , Rfl:    clotrimazole (LOTRIMIN) 1 % cream, Apply to affected area 2 times daily, Disp: 15 g, Rfl: 0   diphenhydrAMINE (BENADRYL) 25 MG tablet, Take 50 mg by mouth every 6 (six) hours as needed for itching or allergies., Disp: , Rfl:    fluconazole (DIFLUCAN) 150 MG tablet, Take 1 tablet (150 mg total) by mouth once as needed for up to 2 doses (take one pill on day 1, and the second pill 3 days later if symptoms do not improve)., Disp: 2 tablet, Rfl: 0   ibuprofen (ADVIL) 200 MG tablet, Take 400-600 mg by mouth every 6 (six) hours as needed for headache or mild pain., Disp: , Rfl:    lidocaine (XYLOCAINE) 2 % solution, Use as directed 10 mLs in the mouth or throat as needed for mouth pain., Disp: 100 mL, Rfl: 0   oxyCODONE-acetaminophen (PERCOCET/ROXICET) 5-325 MG tablet, Take 1 tablet by mouth every 6 (six) hours as needed for severe pain., Disp: 8 tablet, Rfl: 0   promethazine-dextromethorphan (PROMETHAZINE-DM) 6.25-15 MG/5ML syrup, Take 5 mLs by mouth 4 (four) times daily as needed for cough., Disp: 100 mL, Rfl: 0  Observations/Objective: Patient is well-developed, well-nourished in no acute distress.  Resting comfortably at home.  Head is normocephalic, atraumatic.  No labored breathing. Speech is clear and coherent with logical content.  Patient is alert and oriented at baseline.   Assessment and Plan: 1. Dental infection - clindamycin (CLEOCIN) 300 MG capsule; Take 1 capsule (300 mg total) by mouth 3 (three) times daily for 7 days.  Dispense: 21 capsule; Refill: 0 - etodolac (LODINE) 200 MG capsule; Take 1 capsule (200 mg total) by mouth every 8 (eight) hours.  Dispense: 15 capsule; Refill: 0  Giving penicillin allergy, treatments are limited. No issue previously with Clindamycin. Feel she would do better with the standard dose of medication for this issue. Per up-to-date will prescribe 300 mg TID x 7 days. Etodolac  per orders. Continue hygiene, OTC tylenol. Clove paste discussed. Sent her a list of dental resources so she can hopefully get the tooth fully taken care of and avoid recurrent infections. ER precautions reviewed.   Follow Up Instructions: I discussed the assessment and treatment plan with the patient. The patient was provided an opportunity to ask questions and all were answered. The patient agreed with the plan and demonstrated an understanding of the instructions.  A copy of instructions were sent to the patient via MyChart unless otherwise noted below.   The patient was advised to call back or seek an in-person evaluation if the symptoms worsen or if the condition fails to improve as anticipated.  Time:  I spent 10 minutes with the patient via telehealth technology discussing the above problems/concerns.    Laurie Rio, PA-C

## 2023-03-18 ENCOUNTER — Encounter (HOSPITAL_COMMUNITY): Payer: Self-pay

## 2023-03-18 ENCOUNTER — Ambulatory Visit (HOSPITAL_COMMUNITY)
Admission: EM | Admit: 2023-03-18 | Discharge: 2023-03-18 | Disposition: A | Discharge status: Home / Self Care | Payer: Self-pay | Attending: Urgent Care | Admitting: Urgent Care

## 2023-03-18 DIAGNOSIS — R000 Tachycardia, unspecified: Secondary | ICD-10-CM | POA: Insufficient documentation

## 2023-03-18 DIAGNOSIS — I10 Essential (primary) hypertension: Secondary | ICD-10-CM | POA: Insufficient documentation

## 2023-03-18 DIAGNOSIS — F439 Reaction to severe stress, unspecified: Secondary | ICD-10-CM | POA: Insufficient documentation

## 2023-03-18 DIAGNOSIS — F17210 Nicotine dependence, cigarettes, uncomplicated: Secondary | ICD-10-CM | POA: Insufficient documentation

## 2023-03-18 DIAGNOSIS — N760 Acute vaginitis: Secondary | ICD-10-CM | POA: Insufficient documentation

## 2023-03-18 LAB — BASIC METABOLIC PANEL
Anion gap: 12 (ref 5–15)
BUN: 8 mg/dL (ref 6–20)
CO2: 24 mmol/L (ref 22–32)
Calcium: 8.9 mg/dL (ref 8.9–10.3)
Chloride: 101 mmol/L (ref 98–111)
Creatinine, Ser: 0.76 mg/dL (ref 0.44–1.00)
GFR, Estimated: >60 mL/min (ref >60)
Glucose, Bld: 79 mg/dL (ref 70–99)
Potassium: 3.7 mmol/L (ref 3.5–5.1)
Sodium: 137 mmol/L (ref 135–145)

## 2023-03-18 LAB — TSH: TSH: 0.858 u[IU]/mL (ref 0.350–4.500)

## 2023-03-18 MED ORDER — BUPROPION HCL ER (SR) 150 MG PO TB12: 150.0000 mg | ORAL_TABLET | Freq: Two times a day (BID) | ORAL | 5 refills | Status: AC

## 2023-03-18 MED ORDER — METRONIDAZOLE 500 MG PO TABS: 500.0000 mg | ORAL_TABLET | Freq: Two times a day (BID) | ORAL | 0 refills | Status: AC

## 2023-03-18 MED ORDER — PROPRANOLOL HCL ER 60 MG PO CP24: 60.0000 mg | ORAL_CAPSULE | Freq: Every day | ORAL | 2 refills | Status: AC

## 2023-03-18 NOTE — ED Provider Notes (Signed)
[facility]    CSN: [id number] Arrival date & time: 03/18/23  1212      History   Chief Complaint Chief Complaint  Patient presents with  . Vaginal Discharge    HPI Laurie Holland is a 37 y.o. female.    Vaginal Discharge   History reviewed. No pertinent past medical history.  There are no problems to display for this patient.   History reviewed. No pertinent surgical history.  OB History   No obstetric history on file.      Home Medications    Prior to Admission medications   Medication Sig Start Date End Date Taking? Authorizing Provider  acetaminophen (TYLENOL) 500 MG tablet Take 1,000 mg by mouth every 6 (six) hours as needed for moderate pain or headache.    [provider]  aspirin-acetaminophen-caffeine (EXCEDRIN MIGRAINE) [phone] MG tablet Take by mouth every 6 (six) hours as needed for headache.    [provider]  clotrimazole (LOTRIMIN) 1 % cream Apply to affected area 2 times daily 03/01/22   Rising, Lurena Joiner, PA-C  diphenhydrAMINE (BENADRYL) 25 MG tablet Take 50 mg by mouth every 6 (six) hours as needed for itching or allergies.    [provider]  etodolac (LODINE) 200 MG capsule Take 1 capsule (200 mg total) by mouth every 8 (eight) hours. 08/16/22   Waldon Merl, PA-C  fluconazole (DIFLUCAN) 150 MG tablet Take 1 tablet (150 mg total) by mouth once as needed for up to 2 doses (take one pill on day 1, and the second pill 3 days later if symptoms do not improve). 03/01/22   Rising, Lurena Joiner, PA-C  ibuprofen (ADVIL) 200 MG tablet Take 400-600 mg by mouth every 6 (six) hours as needed for headache or mild pain.    [provider]  lidocaine (XYLOCAINE) 2 % solution Use as directed 10 mLs in the mouth or throat as needed for mouth pain. 08/05/20   Particia Nearing, PA-C  oxyCODONE-acetaminophen (PERCOCET/ROXICET) 5-325 MG tablet Take 1 tablet by mouth every 6 (six) hours as needed for severe pain.  07/26/22   Roemhildt, Lorin T, PA-C  promethazine-dextromethorphan (PROMETHAZINE-DM) 6.25-15 MG/5ML syrup Take 5 mLs by mouth 4 (four) times daily as needed for cough. 02/21/21   Particia Nearing, PA-C    Family History Family History  Family history unknown: Yes    Social History Social History   Tobacco Use  . Smoking status: Every Day    Current packs/day: 0.50    Types: Cigarettes  . Smokeless tobacco: Current  Vaping Use  . Vaping status: Never Used  Substance Use Topics  . Alcohol use: Yes    Comment: Socially  . Drug use: Yes    Types: Marijuana    Comment: Occ     Allergies   Penicillins   Review of Systems Review of Systems  Genitourinary:  Positive for vaginal discharge.     Physical Exam Triage Vital Signs ED Triage Vitals  Encounter Vitals Group     BP 03/18/23 1415 (!) 185/126     Systolic BP Percentile --      Diastolic BP Percentile --      Pulse Rate 03/18/23 1415 (!) 103     Resp 03/18/23 1415 16     Temp 03/18/23 1415 98 F (36.7 C)     Temp Source 03/18/23 1415 Oral     SpO2 03/18/23 1415 98 %     Weight 03/18/23 1415 250 lb (113.4 kg)  Height 03/18/23 1415 5\' 5"  (1.651 m)     Head Circumference --      Peak Flow --      Pain Score 03/18/23 1414 6     Pain Loc --      Pain Education --      Exclude from Growth Chart --    No data found.  Updated Vital Signs BP (!) 185/126 (BP Location: Right Arm)   Pulse (!) 103   Temp 98 F (36.7 C) (Oral)   Resp 16   Ht 5\' 5"  (1.651 m)   Wt 250 lb (113.4 kg)   LMP 02/25/2023 (Approximate)   SpO2 98%   BMI 41.60 kg/m   Visual Acuity Right Eye Distance:   Left Eye Distance:   Bilateral Distance:    Right Eye Near:   Left Eye Near:    Bilateral Near:     Physical Exam   UC Treatments / Results  Labs (all labs ordered are listed, but only abnormal results are displayed) Labs Reviewed - No data to display  EKG   Radiology No results found.  Procedures Procedures  (including critical care time)  Medications Ordered in UC Medications - No data to display  Initial Impression / Assessment and Plan / UC Course  I have reviewed the triage vital signs and the nursing notes.  Pertinent labs & imaging results that were available during my care of the patient were reviewed by me and considered in my medical decision making (see chart for details).  Clinical Course as of 03/18/23 1459  Sun Mar 18, 2023  1455 181/129 recheck BP [WC]    Clinical Course User Index [WC] Maretta Bees, [place]    *** Final Clinical Impressions(s) / UC Diagnoses   Final diagnoses:  None   Discharge Instructions   None    ED Prescriptions   None    PDMP not reviewed this encounter.

## 2023-03-18 NOTE — Discharge Instructions (Signed)
Please start taking the Flagyl twice daily for 7 days.  This will treat your vaginal discharge and itching. We will call with the results of the Aptima swab if any changes to treatment indicated. Please avoid all forms of intercourse until the antibiotic has been completed. Do not drink any alcohol while taking the antibiotic.  Start taking bupropion 1 tablet twice daily.  This medication will help with stress levels as well as help you cut back and hopefully quit smoking.  Take the propranolol once daily.  This is typically best taken first thing in the morning, however should it make you feel sleepy, you may take before bed.  Please monitor your blood pressure at home.  Normal readings are 120/80. You must establish care with a primary care physician to have this further managed and evaluated.

## 2023-03-18 NOTE — ED Triage Notes (Signed)
Patient here today with c/o vaginal discharge, itching, and pain X 1-2 weeks. She has tried Monistat with some relief.

## 2023-03-22 LAB — CERVICOVAGINAL ANCILLARY ONLY
Bacterial Vaginitis (gardnerella): POSITIVE — AB
Candida Glabrata: NEGATIVE
Candida Vaginitis: NEGATIVE
Chlamydia: NEGATIVE
Comment: NEGATIVE
Comment: NEGATIVE
Comment: NEGATIVE
Comment: NEGATIVE
Comment: NEGATIVE
Comment: NORMAL
Neisseria Gonorrhea: NEGATIVE
Trichomonas: NEGATIVE

## 2023-03-28 ENCOUNTER — Ambulatory Visit: Payer: Self-pay | Admitting: Family Medicine
